# Patient Record
Sex: Female | Born: 1975 | Race: Black or African American | Hispanic: No | Smoking: Never smoker
Health system: Southern US, Community
[De-identification: ages and names within clinical notes are randomized; demographics above are authoritative.]

## PROBLEM LIST (undated history)

## (undated) DIAGNOSIS — R42 Dizziness and giddiness: Secondary | ICD-10-CM

## (undated) DIAGNOSIS — M329 Systemic lupus erythematosus, unspecified: Secondary | ICD-10-CM

## (undated) DIAGNOSIS — E669 Obesity, unspecified: Secondary | ICD-10-CM

## (undated) DIAGNOSIS — Z8673 Personal history of transient ischemic attack (TIA), and cerebral infarction without residual deficits: Secondary | ICD-10-CM

## (undated) DIAGNOSIS — IMO0002 Reserved for concepts with insufficient information to code with codable children: Secondary | ICD-10-CM

## (undated) DIAGNOSIS — Z86718 Personal history of other venous thrombosis and embolism: Secondary | ICD-10-CM

## (undated) DIAGNOSIS — Z8759 Personal history of other complications of pregnancy, childbirth and the puerperium: Secondary | ICD-10-CM

---

## 1996-09-13 DIAGNOSIS — Z8673 Personal history of transient ischemic attack (TIA), and cerebral infarction without residual deficits: Secondary | ICD-10-CM

## 1996-09-13 HISTORY — DX: Personal history of transient ischemic attack (TIA), and cerebral infarction without residual deficits: Z86.73

## 2007-09-14 DIAGNOSIS — Z86718 Personal history of other venous thrombosis and embolism: Secondary | ICD-10-CM

## 2007-09-14 DIAGNOSIS — Z8759 Personal history of other complications of pregnancy, childbirth and the puerperium: Secondary | ICD-10-CM

## 2007-09-14 HISTORY — DX: Personal history of other complications of pregnancy, childbirth and the puerperium: Z87.59

## 2007-09-14 HISTORY — DX: Personal history of other complications of pregnancy, childbirth and the puerperium: Z86.718

## 2017-10-10 ENCOUNTER — Emergency Department (HOSPITAL_COMMUNITY): Payer: Medicare Other

## 2017-10-10 ENCOUNTER — Other Ambulatory Visit: Payer: Self-pay

## 2017-10-10 ENCOUNTER — Observation Stay (HOSPITAL_COMMUNITY): Payer: Medicare Other

## 2017-10-10 ENCOUNTER — Observation Stay (HOSPITAL_COMMUNITY)
Admission: EM | Admit: 2017-10-10 | Discharge: 2017-10-11 | Disposition: A | Discharge status: Home / Self Care | Payer: Medicare Other | Attending: Internal Medicine | Admitting: Internal Medicine

## 2017-10-10 ENCOUNTER — Encounter (HOSPITAL_COMMUNITY): Payer: Self-pay

## 2017-10-10 DIAGNOSIS — Z8673 Personal history of transient ischemic attack (TIA), and cerebral infarction without residual deficits: Secondary | ICD-10-CM

## 2017-10-10 DIAGNOSIS — R4781 Slurred speech: Secondary | ICD-10-CM | POA: Diagnosis not present

## 2017-10-10 DIAGNOSIS — R29810 Facial weakness: Secondary | ICD-10-CM | POA: Diagnosis not present

## 2017-10-10 DIAGNOSIS — H538 Other visual disturbances: Secondary | ICD-10-CM | POA: Insufficient documentation

## 2017-10-10 DIAGNOSIS — Z86718 Personal history of other venous thrombosis and embolism: Secondary | ICD-10-CM | POA: Diagnosis not present

## 2017-10-10 DIAGNOSIS — R05 Cough: Secondary | ICD-10-CM | POA: Insufficient documentation

## 2017-10-10 DIAGNOSIS — R51 Headache: Secondary | ICD-10-CM | POA: Insufficient documentation

## 2017-10-10 DIAGNOSIS — M3210 Systemic lupus erythematosus, organ or system involvement unspecified: Secondary | ICD-10-CM | POA: Diagnosis not present

## 2017-10-10 DIAGNOSIS — R42 Dizziness and giddiness: Secondary | ICD-10-CM | POA: Diagnosis not present

## 2017-10-10 DIAGNOSIS — R8290 Unspecified abnormal findings in urine: Secondary | ICD-10-CM | POA: Diagnosis present

## 2017-10-10 DIAGNOSIS — R509 Fever, unspecified: Secondary | ICD-10-CM | POA: Insufficient documentation

## 2017-10-10 DIAGNOSIS — Z8759 Personal history of other complications of pregnancy, childbirth and the puerperium: Secondary | ICD-10-CM

## 2017-10-10 DIAGNOSIS — M329 Systemic lupus erythematosus, unspecified: Secondary | ICD-10-CM | POA: Diagnosis present

## 2017-10-10 DIAGNOSIS — IMO0002 Reserved for concepts with insufficient information to code with codable children: Secondary | ICD-10-CM | POA: Diagnosis present

## 2017-10-10 HISTORY — DX: Systemic lupus erythematosus, unspecified: M32.9

## 2017-10-10 HISTORY — DX: Obesity, unspecified: E66.9

## 2017-10-10 HISTORY — DX: Dizziness and giddiness: R42

## 2017-10-10 HISTORY — DX: Personal history of other venous thrombosis and embolism: Z86.718

## 2017-10-10 HISTORY — DX: Personal history of transient ischemic attack (TIA), and cerebral infarction without residual deficits: Z86.73

## 2017-10-10 HISTORY — DX: Reserved for concepts with insufficient information to code with codable children: IMO0002

## 2017-10-10 HISTORY — DX: Personal history of other complications of pregnancy, childbirth and the puerperium: Z87.59

## 2017-10-10 LAB — URINALYSIS, ROUTINE W REFLEX MICROSCOPIC
BILIRUBIN URINE: NEGATIVE
Glucose, UA: NEGATIVE mg/dL
Ketones, ur: NEGATIVE mg/dL
Nitrite: NEGATIVE
PROTEIN: NEGATIVE mg/dL
Specific Gravity, Urine: 1.009 (ref 1.005–1.030)
pH: 5 (ref 5.0–8.0)

## 2017-10-10 LAB — COMPREHENSIVE METABOLIC PANEL
ALT: 14 U/L (ref 14–54)
ANION GAP: 9 (ref 5–15)
AST: 14 U/L — ABNORMAL LOW (ref 15–41)
Albumin: 4 g/dL (ref 3.5–5.0)
Alkaline Phosphatase: 92 U/L (ref 38–126)
BILIRUBIN TOTAL: 0.9 mg/dL (ref 0.3–1.2)
BUN: 6 mg/dL (ref 6–20)
CO2: 23 mmol/L (ref 22–32)
Calcium: 9.3 mg/dL (ref 8.9–10.3)
Chloride: 105 mmol/L (ref 101–111)
Creatinine, Ser: 0.78 mg/dL (ref 0.44–1.00)
GFR calc Af Amer: >60 mL/min (ref >60)
Glucose, Bld: 102 mg/dL — ABNORMAL HIGH (ref 65–99)
POTASSIUM: 4.3 mmol/L (ref 3.5–5.1)
Sodium: 137 mmol/L (ref 135–145)
TOTAL PROTEIN: 7.4 g/dL (ref 6.5–8.1)

## 2017-10-10 LAB — CBC
HEMATOCRIT: 37.5 % (ref 36.0–46.0)
HEMOGLOBIN: 12.3 g/dL (ref 12.0–15.0)
MCH: 26.6 pg (ref 26.0–34.0)
MCHC: 32.8 g/dL (ref 30.0–36.0)
MCV: 81.2 fL (ref 78.0–100.0)
Platelets: 255 10*3/uL (ref 150–400)
RBC: 4.62 MIL/uL (ref 3.87–5.11)
RDW: 14.6 % (ref 11.5–15.5)
WBC: 6.7 10*3/uL (ref 4.0–10.5)

## 2017-10-10 LAB — I-STAT CHEM 8, ED
BUN: 6 mg/dL (ref 6–20)
Calcium, Ion: 1.18 mmol/L (ref 1.15–1.40)
Chloride: 104 mmol/L (ref 101–111)
Creatinine, Ser: 0.7 mg/dL (ref 0.44–1.00)
Glucose, Bld: 101 mg/dL — ABNORMAL HIGH (ref 65–99)
HEMATOCRIT: 39 % (ref 36.0–46.0)
HEMOGLOBIN: 13.3 g/dL (ref 12.0–15.0)
Potassium: 4.4 mmol/L (ref 3.5–5.1)
SODIUM: 139 mmol/L (ref 135–145)
TCO2: 26 mmol/L (ref 22–32)

## 2017-10-10 LAB — I-STAT BETA HCG BLOOD, ED (MC, WL, AP ONLY)

## 2017-10-10 LAB — PROTIME-INR
INR: 1.04
Prothrombin Time: 13.5 seconds (ref 11.4–15.2)

## 2017-10-10 LAB — I-STAT TROPONIN, ED: TROPONIN I, POC: 0 ng/mL (ref 0.00–0.08)

## 2017-10-10 LAB — DIFFERENTIAL
Basophils Absolute: 0 10*3/uL (ref 0.0–0.1)
Basophils Relative: 0 %
EOS PCT: 5 %
Eosinophils Absolute: 0.4 10*3/uL (ref 0.0–0.7)
LYMPHS ABS: 2.2 10*3/uL (ref 0.7–4.0)
Lymphocytes Relative: 33 %
MONOS PCT: 6 %
Monocytes Absolute: 0.4 10*3/uL (ref 0.1–1.0)
NEUTROS PCT: 56 %
Neutro Abs: 3.7 10*3/uL (ref 1.7–7.7)

## 2017-10-10 LAB — RAPID URINE DRUG SCREEN, HOSP PERFORMED
Amphetamines: NOT DETECTED
Barbiturates: NOT DETECTED
Benzodiazepines: NOT DETECTED
COCAINE: NOT DETECTED
OPIATES: NOT DETECTED
Tetrahydrocannabinol: NOT DETECTED

## 2017-10-10 LAB — APTT: aPTT: 29 seconds (ref 24–36)

## 2017-10-10 LAB — ETHANOL

## 2017-10-10 MED ORDER — MORPHINE SULFATE (PF) 4 MG/ML IV SOLN
2.0000 mg | INTRAVENOUS | Status: DC | PRN
Start: 2017-10-10 — End: 2017-10-11
  Administered 2017-10-11 (×2): 2 mg via INTRAVENOUS
  Filled 2017-10-10 (×2): qty 1

## 2017-10-10 MED ORDER — ONDANSETRON HCL 4 MG/2ML IJ SOLN
4.0000 mg | Freq: Once | INTRAMUSCULAR | Status: AC
  Administered 2017-10-10: 4 mg via INTRAVENOUS
  Filled 2017-10-10: qty 2

## 2017-10-10 MED ORDER — PROMETHAZINE HCL 25 MG/ML IJ SOLN: 12.5000 mg | Freq: Four times a day (QID) | INTRAMUSCULAR | Status: DC | PRN

## 2017-10-10 MED ORDER — ONDANSETRON HCL 4 MG/2ML IJ SOLN
4.0000 mg | Freq: Four times a day (QID) | INTRAMUSCULAR | Status: DC | PRN
  Administered 2017-10-11: 4 mg via INTRAVENOUS
  Filled 2017-10-10: qty 2

## 2017-10-10 MED ORDER — DIAZEPAM 5 MG PO TABS
5.0000 mg | ORAL_TABLET | Freq: Once | ORAL | Status: AC
  Administered 2017-10-10: 5 mg via ORAL
  Filled 2017-10-10: qty 1

## 2017-10-10 MED ORDER — LORAZEPAM 2 MG/ML IJ SOLN
0.5000 mg | Freq: Once | INTRAMUSCULAR | Status: AC
  Administered 2017-10-10: 0.5 mg via INTRAVENOUS
  Filled 2017-10-10: qty 1

## 2017-10-10 MED ORDER — GADOBENATE DIMEGLUMINE 529 MG/ML IV SOLN
20.0000 mL | Freq: Once | INTRAVENOUS | Status: AC | PRN
  Administered 2017-10-10: 20 mL via INTRAVENOUS

## 2017-10-10 MED ORDER — MECLIZINE HCL 12.5 MG PO TABS
25.0000 mg | ORAL_TABLET | Freq: Three times a day (TID) | ORAL | Status: DC | PRN
  Administered 2017-10-11 (×2): 25 mg via ORAL
  Filled 2017-10-10 (×2): qty 2

## 2017-10-10 MED ORDER — IOPAMIDOL (ISOVUE-370) INJECTION 76%
INTRAVENOUS | Status: AC
  Administered 2017-10-10: 50 mL
  Filled 2017-10-10: qty 50

## 2017-10-10 NOTE — ED Notes (Addendum)
Patient transported to Xray and MRI  

## 2017-10-10 NOTE — ED Notes (Signed)
Patient transported to CT 

## 2017-10-10 NOTE — Code Documentation (Signed)
Code stroke canceled on arrival per Dr. Laurence SlateAroor.  [facility] notified to cancel code stroke.

## 2017-10-10 NOTE — H&P (Signed)
History and Physical    Marissa Seara Maniscalco RUE:[id number]:[id number] DOB: 11/07/1975 DOA: 10/10/2017  **Will will place patient in observation status based on the expectation that the patient will need hospitalization/ hospital care for <24 hours  PCP: Patient, No Pcp Per Gentry Fitz/UNASSIGNED  Attending physician: Ophelia CharterYates  Patient coming from/Resides with: Private residence  Chief Complaint: Right facial droop and dizziness  HPI: Marissa Spencer is a 42 y.o. female with medical history significant for lupus not on medications, obesity, and history of vertigo.  Patient reports for the past 2 weeks she has been coughing especially at night and unable to sleep due to the coughing as well as having episodes of vomiting which sound like they may be posttussive emesis.  She describes them as flulike symptoms but denies fever.  She says she is coughing up thick sputum.  Today when she awakened she felt very dizzy and weak and has been having fatigue.  She took her daughter to this facility for a preoperative visit and felt like when she was standing up that she was "rocking back and forth, I just felt weird" she also complained of bilateral blurry vision.  She states this is not like vertigo she has had in the past.  The symptoms were accompanied by mild nausea.  Upon arrival to the preoperative evaluation unit staff member felt that the patient was demonstrating right facial drooping and given her above reported symptoms she was sent to the ER for further evaluation and treatment.  Triage documents complaints of dizziness, slurred speech, difficulty with word finding.  She reported to triage nurse that she felt as if the room was spinning.  Code stroke was canceled by neurology after patient arrival.  CT of the head without any acute abnormalities.  CTA head/neck without any abnormalities.  Neurology recommended admit to medicine for possible stroke.  Recommended proceeding with MRI of brain to better evaluate.  When I  questioned patient she denied difficulty with speaking and only reports the dizziness and the disequilibrium symptoms.  ED Course:  Vital Signs: See below CT head, CTA head and neck: As above Chest x-ray: Ordered by EDP but not completed at time of my evaluation Lab data: Sodium 137, potassium 4.3, chloride 105, CO2 23, glucose 102, BUN 6, creatinine 0.78, anion gap 9, LFTs not elevated, POC troponin 0.00, white count 6700 with normal differential, hemoglobin 12.3, platelets 255,000, urinalysis abnormal with hazy appearance, small hemoglobin, small leukocytes, nitrite negative, slightly low specific gravity 1.009, rare bacteria, WBC 6-30, UDS negative, alcohol <10, i-STAT hCG <5.0 Medications and treatments: Zofran 4 mg IV x1, Ativan 0.5 mg IV x1  Review of Systems:  In addition to the HPI above,  No Fever-chills, myalgias or other constitutional symptoms No Headache, changes with Vision or hearing, new weakness, tingling, numbness in any extremity, dysarthria or word finding difficulty, gait disturbance or imbalance, tremors or seizure activity No problems swallowing food or Liquids, indigestion/reflux, choking or coughing while eating, abdominal pain with or after eating No Chest pain, Shortness of Breath, palpitations, orthopnea or DOE No Abdominal pain, melena,hematochezia, dark tarry stools, constipation No dysuria, malodorous urine, hematuria or flank pain No new skin rashes, lesions, masses or bruises, No new joint pains, aches, swelling or redness No recent unintentional weight gain or loss No polyuria, polydypsia or polyphagia   Past Medical History:  Diagnosis Date  . History of cardioembolic cerebrovascular accident (CVA) 1998   DURING PREGNANCY  . History of maternal deep vein thrombosis (DVT) 2009  2009 WHEN PREGNANT  . Lupus   . Obesity   . Vertigo     History reviewed. No pertinent surgical history.  Social History   Socioeconomic History  . Marital status:  Legally Separated    Spouse name: Not on file  . Number of children: Not on file  . Years of education: Not on file  . Highest education level: Not on file  Social Needs  . Financial resource strain: Not on file  . Food insecurity - worry: Not on file  . Food insecurity - inability: Not on file  . Transportation needs - medical: Not on file  . Transportation needs - non-medical: Not on file  Occupational History  . Not on file  Tobacco Use  . Smoking status: Never Smoker  . Smokeless tobacco: Never Used  Substance and Sexual Activity  . Alcohol use: No    Frequency: Never  . Drug use: Not on file  . Sexual activity: Not on file  Other Topics Concern  . Not on file  Social History Narrative  . Not on file    Mobility: Independent Work history: Disabled   Allergies  Allergen Reactions  . Sulfa Antibiotics Rash    Family History  Problem Relation Age of Onset  . Hypertension Mother   . Kidney disease Father   . Diabetes Sister   . Bipolar disorder Sister      Prior to Admission medications   Medication Sig Start Date End Date Taking? Authorizing Provider  HYDROcodone-acetaminophen (NORCO/VICODIN) 5-325 MG tablet Take 1 tablet by mouth every 6 (six) hours as needed for pain. 09/28/17  Yes [provider]  ibuprofen (ADVIL,MOTRIN) 200 MG tablet Take 200-400 mg by mouth every 6 (six) hours as needed (for pain or headaches).   Yes [provider]  methocarbamol (ROBAXIN) 500 MG tablet Take 500-1,000 mg by mouth 4 (four) times daily as needed for muscle spasms.  09/28/17  Yes [provider]    Physical Exam: Vitals:   10/10/17 1138 10/10/17 1145  BP: 108/79 131/72  Pulse: 88 85  Resp:  (!) 22  SpO2: 97% 99%      Constitutional: NAD, calm, uncomfortable 2/2 ongoing disequilibrium symptoms Eyes: PERRL, lids and conjunctivae normal ENMT: Mucous membranes are dry. Posterior pharynx clear of any exudate or lesions. Neck: normal, supple,  no masses, no thyromegaly Respiratory: clear to auscultation bilaterally, no wheezing, no crackles. Normal respiratory effort. No accessory muscle use.  Cardiovascular: Regular rate and rhythm, no murmurs / rubs / gallops. No extremity edema. 2+ pedal pulses. No carotid bruits.  Abdomen: no tenderness, no masses palpated. No hepatosplenomegaly. Bowel sounds positive.  Musculoskeletal: no clubbing / cyanosis. No joint deformity upper and lower extremities. Good ROM, no contractures. Normal muscle tone.  Skin: no rashes, lesions, ulcers. No induration-she has slightly reddened area beneath both nares Neurologic: CN 2-12 grossly intact.  Patient having difficulty keeping eyes open after receipt of Ativan therefore EOM exam limited but did not elicit vertigo symptoms/nausea or nystagmus with testing.  Sensation intact, DTR normal. Strength 5/5 x all 4 extremities.  Psychiatric: Normal judgment and insight. Drowsy but awakens easily - oriented x 3. Normal mood.    Labs on Admission: I have personally reviewed following labs and imaging studies  CBC: Recent Labs  Lab 10/10/17 1118 10/10/17 1131  WBC 6.7  --   NEUTROABS 3.7  --   HGB 12.3 13.3  HCT 37.5 39.0  MCV 81.2  --   PLT  255  --    Basic Metabolic Panel: Recent Labs  Lab 10/10/17 1118 10/10/17 1131  NA 137 139  K 4.3 4.4  CL 105 104  CO2 23  --   GLUCOSE 102* 101*  BUN 6 6  CREATININE 0.78 0.70  CALCIUM 9.3  --    GFR: CrCl cannot be calculated (Unknown ideal weight.). Liver Function Tests: Recent Labs  Lab 10/10/17 1118  AST 14*  ALT 14  ALKPHOS 92  BILITOT 0.9  PROT 7.4  ALBUMIN 4.0   No results for input(s): LIPASE, AMYLASE in the last 168 hours. No results for input(s): AMMONIA in the last 168 hours. Coagulation Profile: Recent Labs  Lab 10/10/17 1118  INR 1.04   Cardiac Enzymes: No results for input(s): CKTOTAL, CKMB, CKMBINDEX, TROPONINI in the last 168 hours. BNP (last 3 results) No results for  input(s): PROBNP in the last 8760 hours. HbA1C: No results for input(s): HGBA1C in the last 72 hours. CBG: No results for input(s): GLUCAP in the last 168 hours. Lipid Profile: No results for input(s): CHOL, HDL, LDLCALC, TRIG, CHOLHDL, LDLDIRECT in the last 72 hours. Thyroid Function Tests: No results for input(s): TSH, T4TOTAL, FREET4, T3FREE, THYROIDAB in the last 72 hours. Anemia Panel: No results for input(s): VITAMINB12, FOLATE, FERRITIN, TIBC, IRON, RETICCTPCT in the last 72 hours. Urine analysis:    Component Value Date/Time   COLORURINE YELLOW 10/10/2017 1238   APPEARANCEUR HAZY (A) 10/10/2017 1238   LABSPEC 1.009 10/10/2017 1238   PHURINE 5.0 10/10/2017 1238   GLUCOSEU NEGATIVE 10/10/2017 1238   HGBUR SMALL (A) 10/10/2017 1238   BILIRUBINUR NEGATIVE 10/10/2017 1238   KETONESUR NEGATIVE 10/10/2017 1238   PROTEINUR NEGATIVE 10/10/2017 1238   NITRITE NEGATIVE 10/10/2017 1238   LEUKOCYTESUR SMALL (A) 10/10/2017 1238   Sepsis Labs: @LABRCNTIP (procalcitonin:4,lacticidven:4) )No results found for this or any previous visit (from the past 240 hour(s)).   Radiological Exams on Admission: Ct Angio Head W Or Wo Contrast  Result Date: 10/10/2017 CLINICAL DATA:  42 y/o F; code stroke this morning with dizziness. Right-sided headache. EXAM: CT ANGIOGRAPHY HEAD AND NECK TECHNIQUE: Multidetector CT imaging of the head and neck was performed using the standard protocol during bolus administration of intravenous contrast. Multiplanar CT image reconstructions and MIPs were obtained to evaluate the vascular anatomy. Carotid stenosis measurements (when applicable) are obtained utilizing NASCET criteria, using the distal internal carotid diameter as the denominator. CONTRAST:  50mL ISOVUE-370 IOPAMIDOL (ISOVUE-370) INJECTION 76% COMPARISON:  10/10/2017 CT head FINDINGS: CTA NECK FINDINGS Aortic arch: Standard branching. Imaged portion shows no evidence of aneurysm or dissection. No significant  stenosis of the major arch vessel origins. Right carotid system: No evidence of dissection, stenosis (50% or greater) or occlusion. Left carotid system: No evidence of dissection, stenosis (50% or greater) or occlusion. Vertebral arteries: Left dominant. Accessory right V1. No evidence of dissection, stenosis (50% or greater) or occlusion. Skeleton: Negative. Other neck: Negative. Upper chest: Negative. Review of the MIP images confirms the above findings CTA HEAD FINDINGS Anterior circulation: No significant stenosis, proximal occlusion, aneurysm, or vascular malformation. Posterior circulation: No significant stenosis, proximal occlusion, aneurysm, or vascular malformation. Venous sinuses: As permitted by contrast timing, patent. Anatomic variants: Bilateral posterior communicating arteries. Fenestrated anterior communicating artery. Delayed phase: No abnormal intracranial enhancement. Review of the MIP images confirms the above findings IMPRESSION: 1. Patent carotid and vertebral arteries. No dissection, aneurysm, or hemodynamically significant stenosis utilizing NASCET criteria. 2. Patent anterior and posterior intracranial circulation. No large vessel  occlusion, aneurysm, or significant stenosis. Electronically Signed   By: Mitzi Hansen M.D.   On: 10/10/2017 13:41   Dg Chest 2 View  Result Date: 10/10/2017 CLINICAL DATA:  Cough and slurred speech. EXAM: CHEST  2 VIEW COMPARISON:  None. FINDINGS: The heart size and mediastinal contours are within normal limits. Both lungs are clear. The visualized skeletal structures are unremarkable. IMPRESSION: No active cardiopulmonary disease. Electronically Signed   By: Harmon Pier M.D.   On: 10/10/2017 16:20   Ct Angio Neck W Or Wo Contrast  Result Date: 10/10/2017 CLINICAL DATA:  42 y/o F; code stroke this morning with dizziness. Right-sided headache. EXAM: CT ANGIOGRAPHY HEAD AND NECK TECHNIQUE: Multidetector CT imaging of the head and neck was  performed using the standard protocol during bolus administration of intravenous contrast. Multiplanar CT image reconstructions and MIPs were obtained to evaluate the vascular anatomy. Carotid stenosis measurements (when applicable) are obtained utilizing NASCET criteria, using the distal internal carotid diameter as the denominator. CONTRAST:  50mL ISOVUE-370 IOPAMIDOL (ISOVUE-370) INJECTION 76% COMPARISON:  10/10/2017 CT head FINDINGS: CTA NECK FINDINGS Aortic arch: Standard branching. Imaged portion shows no evidence of aneurysm or dissection. No significant stenosis of the major arch vessel origins. Right carotid system: No evidence of dissection, stenosis (50% or greater) or occlusion. Left carotid system: No evidence of dissection, stenosis (50% or greater) or occlusion. Vertebral arteries: Left dominant. Accessory right V1. No evidence of dissection, stenosis (50% or greater) or occlusion. Skeleton: Negative. Other neck: Negative. Upper chest: Negative. Review of the MIP images confirms the above findings CTA HEAD FINDINGS Anterior circulation: No significant stenosis, proximal occlusion, aneurysm, or vascular malformation. Posterior circulation: No significant stenosis, proximal occlusion, aneurysm, or vascular malformation. Venous sinuses: As permitted by contrast timing, patent. Anatomic variants: Bilateral posterior communicating arteries. Fenestrated anterior communicating artery. Delayed phase: No abnormal intracranial enhancement. Review of the MIP images confirms the above findings IMPRESSION: 1. Patent carotid and vertebral arteries. No dissection, aneurysm, or hemodynamically significant stenosis utilizing NASCET criteria. 2. Patent anterior and posterior intracranial circulation. No large vessel occlusion, aneurysm, or significant stenosis. Electronically Signed   By: Mitzi Hansen M.D.   On: 10/10/2017 13:41   Ct Head Code Stroke Wo Contrast  Result Date: 10/10/2017 CLINICAL DATA:   Code stroke. Acute onset of dizziness. Slurred speech. EXAM: CT HEAD WITHOUT CONTRAST TECHNIQUE: Contiguous axial images were obtained from the base of the skull through the vertex without intravenous contrast. COMPARISON:  None. FINDINGS: Brain: No acute infarct, hemorrhage, or mass lesion is present. The ventricles are of normal size. No significant white matter disease is present. The brainstem and cerebellum are normal. Vascular: No hyperdense vessel or unexpected calcification. Skull: Calvarium is intact. No focal lytic or blastic lesions are present. Sinuses/Orbits: Mild mucosal thickening is present throughout the ethmoid air cells and bilateral maxillary sinuses. No fluid levels are present. There is some fluid in the inferior right mastoid air cells. Left mastoid air cells and bilateral middle ear cavities are clear. ASPECTS [facility] Stroke Program Early CT Score) - Ganglionic level infarction (caudate, lentiform nuclei, internal capsule, insula, M1-M3 cortex): 7/7 - Supraganglionic infarction (M4-M6 cortex): 3/3 Total score (0-10 with 10 being normal): 10/10 IMPRESSION: 1. Normal CT appearance of the brain 2. Mild sinus disease. 3. ASPECTS is 10/10 The above was relayed via text pager to Dr. Laurence Slate on 10/10/2017 at 11:34 . Electronically Signed   By: Marin Roberts M.D.   On: 10/10/2017 11:35    EKG: (Independently reviewed)  sinus rhythm with ventricular rate 98 bpm, QTC 475 ms, normal R wave rotation, no acute ischemic changes  Assessment/Plan Principal Problem:   Facial droop/ History of CVA (cerebrovascular accident) -Patient presents with dizziness and disequilibrium symptoms with reported right facial droop-I did not appreciate right facial droop on exam -Neurology has evaluated and code stroke canceled but recommends obtain MRI to rule out stroke -In review of CareEverywhere/rheumatology notes from [facility] patient reportedly had a stroke while pregnant in 1998 and based on rheumatology note  from September 2017 she was supposed to be taking low-dose Lipitor and aspirin 81 mg daily -Stat MRI brain -Frequent neuro checks -CT/CT angiogram imaging without any concerning findings -We will defer on ordering echocardiogram, carotid duplex, PT/OT/SLP evaluation, HgbA1c/FLP unless MR positive for stroke -Patient has been sick with possible viral illness versus lupus flare for 2 weeks and therefore right facial drooping could be more reflective of Bell's palsy -EDP has previously ordered RN stroke swallow screen so will remain NPO until completed -Has prior history of vertigo and reports dizziness with standing and recent recurrent nausea and vomiting so we will check orthostatic vital signs-patient may need IV fluids/IV bolus -IV Zofran/Phenergan PRN for nausea and vomiting -If evaluation more consistent with vertigo then will need to begin Antivert  Active Problems:   Abnormal urinalysis -Urinalysis concerning for either dehydration or UTI although patient asymptomatic regarding UTI symptoms -Urine culture -No proteinuria so doubt lupus related nephritis    Lupus -In review of [facility] rheumatology notes patient apparently diagnosed with lupus at age 38 and re-diagnosed at age 89 after a stroke through "blood work".  Her other associated symptoms included hair loss, nasal and scalp sores and frequent illnesses -She established care with rheumatologist at [facility] in September 2017 and was resumed on Plaquenil-has never taken medications such as CellCept -[facility] documents the positive ANA -Patient reporting excessive fatigue as well as a reddened area underneath the nares that she attributes to recent congestion although this could also be related to lupus flare -Patient states has not established with either PCP or rheumatologist in [place] consulted this admission to help patient with establishing with local PCP who then can reinitiate patient's Plaquenil-she apparently has established  residency here in [place] and now has Medicare. -I suspect her fatigue sx's are reflective of untreated lupus and she needs to resume her Plaquenil -Of note, telephone documentation from the rheumatology clinic at [facility] both in March and April 2018 demonstrate attempts to notify patient for an appointment.     Cough -She reports 2 weeks of cough with what sounds like post-tussive emesis; states thick productive cough but unable to say if clear or color in sputum -Chest x-ray ordered by EDP and is pending relation    History of maternal deep vein thrombosis (DVT) -History obtained from documentation from [facility] -Without any lower extremity swelling      DVT prophylaxis: Lovenox Code Status: Full Family Communication: Daughter at bedside Disposition Plan: Home Consults called: Neurology/Aroor    Russella Dar ANP-BC [facility] Pager [phone]   If 7PM-7AM, please contact night-coverage [email or web address] Password [facility]  10/10/2017, 4:31 PM

## 2017-10-10 NOTE — ED Triage Notes (Signed)
[facility]- pt arrives from doctor office with complaint of dizziness, slurred speech, difficulty word finding. Pt alert and oriented. States she feels as though the room is spinning. Code stroke cancelled by neurology on arrival.

## 2017-10-10 NOTE — ED Provider Notes (Signed)
[facility] EMERGENCY DEPARTMENT Provider Note   CSN: [id number] Arrival date & time: 10/10/17  1114     History   Chief Complaint Chief Complaint  Patient presents with  . Dizziness    HPI Marissa Spencer is a 42 y.o. female.  The history is provided by the patient and the EMS personnel. No language interpreter was used.  Dizziness    Marissa Spencer is a 42 y.o. female who presents to the Emergency Department complaining of dizziness.  Has a history of lupus and prior CVA and reports dizziness that started this morning.last known normal last night.  She reports several days of temperature to 101 at home with mild cough and feeling poorly.  This morning when she awoke she felt very dizzy with blurred vision.  Prior to ED arrival she had speech difficulties per EMS with slurred and altered speech as well as right facial droop and she presented as a code stroke.  Patient endorses nausea as well as dizziness.  Past Medical History:  Diagnosis Date  . History of cardioembolic cerebrovascular accident (CVA) 1998   DURING PREGNANCY  . History of maternal deep vein thrombosis (DVT) 2009   2009 WHEN PREGNANT  . Lupus   . Obesity   . Vertigo     Patient Active Problem List   Diagnosis Date Noted  . Facial droop 10/10/2017  . Abnormal urinalysis 10/10/2017  . Lupus 10/10/2017  . History of maternal deep vein thrombosis (DVT) 10/10/2017  . History of CVA (cerebrovascular accident) 10/10/2017    History reviewed. No pertinent surgical history.  OB History    No data available       Home Medications    Prior to Admission medications   Medication Sig Start Date End Date Taking? Authorizing Provider  HYDROcodone-acetaminophen (NORCO/VICODIN) 5-325 MG tablet Take 1 tablet by mouth every 6 (six) hours as needed for pain. 09/28/17  Yes [provider]  ibuprofen (ADVIL,MOTRIN) 200 MG tablet Take 200-400 mg by mouth every 6 (six) hours as needed (for  pain or headaches).   Yes [provider]  methocarbamol (ROBAXIN) 500 MG tablet Take 500-1,000 mg by mouth 4 (four) times daily as needed for muscle spasms.  09/28/17  Yes [provider]    Family History Family History  Problem Relation Age of Onset  . Hypertension Mother   . Kidney disease Father   . Diabetes Sister   . Bipolar disorder Sister     Social History Social History   Tobacco Use  . Smoking status: Never Smoker  . Smokeless tobacco: Never Used  Substance Use Topics  . Alcohol use: No    Frequency: Never  . Drug use: Not on file     Allergies   Sulfa antibiotics   Review of Systems Review of Systems  Neurological: Positive for dizziness.  All other systems reviewed and are negative.    Physical Exam Updated Vital Signs BP 131/72   Pulse 85   Resp (!) 22   LMP 09/16/2017   SpO2 99%   Physical Exam  Constitutional: She appears well-developed and well-nourished.  Uncomfortable appearing  HENT:  Head: Normocephalic and atraumatic.  Cardiovascular: Normal rate and regular rhythm.  No murmur heard. Pulmonary/Chest: Effort normal and breath sounds normal. No respiratory distress.  Abdominal: Soft. There is no tenderness. There is no rebound and no guarding.  Musculoskeletal: She exhibits no edema or tenderness.  Neurological: She is alert.  Right facial droop.  Pupils equal  round and reactive, EOMI.  5 out of 5 strength in all 4 extremities.  Skin: Skin is warm and dry. Capillary refill takes less than 2 seconds.  Psychiatric: She has a normal mood and affect. Her behavior is normal.  Nursing note and vitals reviewed.    ED Treatments / Results  Labs (all labs ordered are listed, but only abnormal results are displayed) Labs Reviewed  COMPREHENSIVE METABOLIC PANEL - Abnormal; Notable for the following components:      Result Value   Glucose, Bld 102 (*)    AST 14 (*)    All other components within normal limits    URINALYSIS, ROUTINE W REFLEX MICROSCOPIC - Abnormal; Notable for the following components:   APPearance HAZY (*)    Hgb urine dipstick SMALL (*)    Leukocytes, UA SMALL (*)    Bacteria, UA RARE (*)    Squamous Epithelial / LPF 0-5 (*)    All other components within normal limits  I-STAT CHEM 8, ED - Abnormal; Notable for the following components:   Glucose, Bld 101 (*)    All other components within normal limits  URINE CULTURE  ETHANOL  PROTIME-INR  APTT  CBC  DIFFERENTIAL  RAPID URINE DRUG SCREEN, HOSP PERFORMED  I-STAT TROPONIN, ED  I-STAT BETA HCG BLOOD, ED ([facility], [facility], [facility] ONLY)    EKG  EKG Interpretation  Date/Time:  Monday October 10 2017 11:39:32 EST Ventricular Rate:  98 PR Interval:    QRS Duration: 83 QT Interval:  372 QTC Calculation: 475 R Axis:   60 Text Interpretation:  Sinus rhythm Low voltage, precordial leads Confirmed by Tilden Fossa [phone]) on 10/10/2017 11:46:03 AM       Radiology Ct Angio Head W Or Wo Contrast  Result Date: 10/10/2017 CLINICAL DATA:  42 y/o F; code stroke this morning with dizziness. Right-sided headache. EXAM: CT ANGIOGRAPHY HEAD AND NECK TECHNIQUE: Multidetector CT imaging of the head and neck was performed using the standard protocol during bolus administration of intravenous contrast. Multiplanar CT image reconstructions and MIPs were obtained to evaluate the vascular anatomy. Carotid stenosis measurements (when applicable) are obtained utilizing NASCET criteria, using the distal internal carotid diameter as the denominator. CONTRAST:  50mL ISOVUE-370 IOPAMIDOL (ISOVUE-370) INJECTION 76% COMPARISON:  10/10/2017 CT head FINDINGS: CTA NECK FINDINGS Aortic arch: Standard branching. Imaged portion shows no evidence of aneurysm or dissection. No significant stenosis of the major arch vessel origins. Right carotid system: No evidence of dissection, stenosis (50% or greater) or occlusion. Left carotid system: No evidence of dissection,  stenosis (50% or greater) or occlusion. Vertebral arteries: Left dominant. Accessory right V1. No evidence of dissection, stenosis (50% or greater) or occlusion. Skeleton: Negative. Other neck: Negative. Upper chest: Negative. Review of the MIP images confirms the above findings CTA HEAD FINDINGS Anterior circulation: No significant stenosis, proximal occlusion, aneurysm, or vascular malformation. Posterior circulation: No significant stenosis, proximal occlusion, aneurysm, or vascular malformation. Venous sinuses: As permitted by contrast timing, patent. Anatomic variants: Bilateral posterior communicating arteries. Fenestrated anterior communicating artery. Delayed phase: No abnormal intracranial enhancement. Review of the MIP images confirms the above findings IMPRESSION: 1. Patent carotid and vertebral arteries. No dissection, aneurysm, or hemodynamically significant stenosis utilizing NASCET criteria. 2. Patent anterior and posterior intracranial circulation. No large vessel occlusion, aneurysm, or significant stenosis. Electronically Signed   By: Mitzi Hansen M.D.   On: 10/10/2017 13:41   Dg Chest 2 View  Result Date: 10/10/2017 CLINICAL DATA:  Cough and slurred speech. EXAM:  CHEST  2 VIEW COMPARISON:  None. FINDINGS: The heart size and mediastinal contours are within normal limits. Both lungs are clear. The visualized skeletal structures are unremarkable. IMPRESSION: No active cardiopulmonary disease. Electronically Signed   By: Harmon PierJeffrey  Hu M.D.   On: 10/10/2017 16:20   Ct Angio Neck W Or Wo Contrast  Result Date: 10/10/2017 CLINICAL DATA:  42 y/o F; code stroke this morning with dizziness. Right-sided headache. EXAM: CT ANGIOGRAPHY HEAD AND NECK TECHNIQUE: Multidetector CT imaging of the head and neck was performed using the standard protocol during bolus administration of intravenous contrast. Multiplanar CT image reconstructions and MIPs were obtained to evaluate the vascular anatomy.  Carotid stenosis measurements (when applicable) are obtained utilizing NASCET criteria, using the distal internal carotid diameter as the denominator. CONTRAST:  50mL ISOVUE-370 IOPAMIDOL (ISOVUE-370) INJECTION 76% COMPARISON:  10/10/2017 CT head FINDINGS: CTA NECK FINDINGS Aortic arch: Standard branching. Imaged portion shows no evidence of aneurysm or dissection. No significant stenosis of the major arch vessel origins. Right carotid system: No evidence of dissection, stenosis (50% or greater) or occlusion. Left carotid system: No evidence of dissection, stenosis (50% or greater) or occlusion. Vertebral arteries: Left dominant. Accessory right V1. No evidence of dissection, stenosis (50% or greater) or occlusion. Skeleton: Negative. Other neck: Negative. Upper chest: Negative. Review of the MIP images confirms the above findings CTA HEAD FINDINGS Anterior circulation: No significant stenosis, proximal occlusion, aneurysm, or vascular malformation. Posterior circulation: No significant stenosis, proximal occlusion, aneurysm, or vascular malformation. Venous sinuses: As permitted by contrast timing, patent. Anatomic variants: Bilateral posterior communicating arteries. Fenestrated anterior communicating artery. Delayed phase: No abnormal intracranial enhancement. Review of the MIP images confirms the above findings IMPRESSION: 1. Patent carotid and vertebral arteries. No dissection, aneurysm, or hemodynamically significant stenosis utilizing NASCET criteria. 2. Patent anterior and posterior intracranial circulation. No large vessel occlusion, aneurysm, or significant stenosis. Electronically Signed   By: Mitzi HansenLance  Furusawa-Stratton M.D.   On: 10/10/2017 13:41   Ct Head Code Stroke Wo Contrast  Result Date: 10/10/2017 CLINICAL DATA:  Code stroke. Acute onset of dizziness. Slurred speech. EXAM: CT HEAD WITHOUT CONTRAST TECHNIQUE: Contiguous axial images were obtained from the base of the skull through the vertex  without intravenous contrast. COMPARISON:  None. FINDINGS: Brain: No acute infarct, hemorrhage, or mass lesion is present. The ventricles are of normal size. No significant white matter disease is present. The brainstem and cerebellum are normal. Vascular: No hyperdense vessel or unexpected calcification. Skull: Calvarium is intact. No focal lytic or blastic lesions are present. Sinuses/Orbits: Mild mucosal thickening is present throughout the ethmoid air cells and bilateral maxillary sinuses. No fluid levels are present. There is some fluid in the inferior right mastoid air cells. Left mastoid air cells and bilateral middle ear cavities are clear. ASPECTS [facility]([place] Stroke Program Early CT Score) - Ganglionic level infarction (caudate, lentiform nuclei, internal capsule, insula, M1-M3 cortex): 7/7 - Supraganglionic infarction (M4-M6 cortex): 3/3 Total score (0-10 with 10 being normal): 10/10 IMPRESSION: 1. Normal CT appearance of the brain 2. Mild sinus disease. 3. ASPECTS is 10/10 The above was relayed via text pager to Dr. Laurence SlateAroor on 10/10/2017 at 11:34 . Electronically Signed   By: Marin Robertshristopher  Mattern M.D.   On: 10/10/2017 11:35    Procedures Procedures (including critical care time)  Medications Ordered in ED Medications  ondansetron (ZOFRAN) injection 4 mg (not administered)    Or  promethazine (PHENERGAN) injection 12.5 mg (not administered)  ondansetron (ZOFRAN) injection 4 mg (4 mg  Intravenous Given 10/10/17 1156)  iopamidol (ISOVUE-370) 76 % injection (50 mLs  Contrast Given 10/10/17 1253)  LORazepam (ATIVAN) injection 0.5 mg (0.5 mg Intravenous Given 10/10/17 1235)     Initial Impression / Assessment and Plan / ED Course  I have reviewed the triage vital signs and the nursing notes.  Pertinent labs & imaging results that were available during my care of the patient were reviewed by me and considered in my medical decision making (see chart for details).     Patient here for evaluation of  dizziness, facial droop, speech difficulties prior to ED arrival.  She is uncomfortable appearing on examination and appears very symptomatic.  She has facial droop but no other focal neurologic findings.  She presented as a code stroke and this was canceled after evaluation by the neurologist in the emergency department.  She was treated with Ativan for dizziness with no significant improvement in her symptoms.  Plan to admit to medicine service for further evaluation and treatment of her symptoms per neurology recommendations.  Final Clinical Impressions(s) / ED Diagnoses   Final diagnoses:  None    ED Discharge Orders    None       Tilden Fossa, MD 10/10/17 [phone]

## 2017-10-10 NOTE — Consult Note (Addendum)
Requesting Physician: Dr. Madilyn Hook    Chief Complaint: Dizziness  History obtained from: [name]  HPI:                                                                                                                                       Marissa Spencer is an 42 y.o. 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 LKN was stated by EMS to be 10.40, however patient states her symptoms started on waking up in the morning.  She states she felt dizzy and felt her vision was off this morning. Her high school daughter arrived home this morning and she drove her duaghter to orthopedic clinic despite not feeling well. At the clinic, staff noted patient had slurred speech and her dizziness got worse and EMS was alerted.   On arrival patient noted to have right LMN facial palsy, right eye ptosis. Patient also states her entire right eye vision is blurry and slightly weaker right hand grip strength. CT head was negative for hemorrhage and stroke alert cancelled as pt not an LVO and outside tPA window.  She states she has been having cough and low grade fever x 1 week.     Date last known well: 1.27.19 Time last known well: ? Sometime last night before going to bed tPA Given: no, outside window Baseline MRS 1     History reviewed. No pertinent past medical history.  History reviewed. No pertinent surgical history.  History reviewed. No pertinent family history. Social History:  reports that  has never smoked. she has never used smokeless tobacco. She reports that she does not drink alcohol. Her drug history is not on file.  Allergies:  Allergies  Allergen Reactions  . Sulfa Antibiotics     Medications:                                                                                                                        I reviewed home medications. None    ROS:  14 systems reviewed and negative except above    Examination:                                                                                                      General: Appears well-developed and well-nourished.  Psych: Affect appropriate to situation Eyes: No scleral injection HENT: No OP obstrucion Head: Normocephalic.  Cardiovascular: Normal rate and regular rhythm.  Respiratory: Effort normal and breath sounds normal to anterior ascultation GI: Soft.  No distension. There is no tenderness.  Skin: WDI   Neurological Examination Mental Status: Alert, oriented, thought content appropriate.  Speech fluent without evidence of aphasia. Able to follow 3 step commands without difficulty. Cranial Nerves: II: Visual fields grossly : difficulty to assess due to non cooperation, III,IV, VI: R eye ptosis, extra-ocular motions intact bilaterally, pupils equal, round, reactive to light and accommodation V,VII: reduced forehead wrinkling,mild NFF on R side of face  VIII: hearing normal bilaterally IX,X: uvula rises symmetrically XI: bilateral shoulder shrug XII: midline tongue extension Motor: Right : Upper extremity   4/5    Left:     Upper extremity   5/5  Lower extremity   5/5     Lower extremity   5/5 Tone and bulk:normal tone throughout; no atrophy noted Sensory: Pinprick and light touch intact throughout, bilaterally Deep Tendon Reflexes: 2+ and symmetric throughout Plantars: Right: downgoing   Left: downgoing Cerebellar: normal finger-to-nose, normal rapid alternating movements and normal heel-to-shin test Gait: normal gait and station     Lab Results: Basic Metabolic Panel: Recent Labs  Lab 10/10/17 1131  NA 139  K 4.4  CL 104  GLUCOSE 101*  BUN 6  CREATININE 0.70    CBC: Recent Labs  Lab 10/10/17 1118 10/10/17 1131  WBC 6.7  --   NEUTROABS 3.7  --   HGB 12.3 13.3  HCT 37.5 39.0  MCV 81.2  --    PLT 255  --     Coagulation Studies: Recent Labs    10/10/17 1118  LABPROT 13.5  INR 1.04    Imaging: Ct Head Code Stroke Wo Contrast  Result Date: 10/10/2017 CLINICAL DATA:  Code stroke. Acute onset of dizziness. Slurred speech. EXAM: CT HEAD WITHOUT CONTRAST TECHNIQUE: Contiguous axial images were obtained from the base of the skull through the vertex without intravenous contrast. COMPARISON:  None. FINDINGS: Brain: No acute infarct, hemorrhage, or mass lesion is present. The ventricles are of normal size. No significant white matter disease is present. The brainstem and cerebellum are normal. Vascular: No hyperdense vessel or unexpected calcification. Skull: Calvarium is intact. No focal lytic or blastic lesions are present. Sinuses/Orbits: Mild mucosal thickening is present throughout the ethmoid air cells and bilateral maxillary sinuses. No fluid levels are present. There is some fluid in the inferior right mastoid air cells. Left mastoid air cells and bilateral middle ear cavities are clear. ASPECTS [facility] Stroke Program Early CT Score) - Ganglionic level infarction (caudate, lentiform nuclei, internal capsule, insula, M1-M3 cortex): 7/7 - Supraganglionic infarction (M4-M6 cortex): 3/3 Total score (0-10 with 10 being normal):  10/10 IMPRESSION: 1. Normal CT appearance of the brain 2. Mild sinus disease. 3. ASPECTS is 10/10 The above was relayed via text pager to Dr. Laurence SlateAroor on 10/10/2017 at 11:34 . Electronically Signed   By: Marin Robertshristopher  Mattern M.D.   On: 10/10/2017 11:35     ASSESSMENT AND PLAN  42 y.o. 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   Possible Stroke  Admit to medicine  CTA head negative for LVO, signficant stenosis, dissection  Mri brain wo contrast showed no acute stroke. MRI Brain w/contrast and MR venogram was ordered to r/o CVT given history of lupus and stroke.    Neurology will follow.    Tosh Glaze [facility] Pager Number [phone]

## 2017-10-10 NOTE — ED Notes (Signed)
During orthostatic vitals, while standing (with assistance), pt stated that her "feet were tingling". Pt also stated that she felt nauseous. Informed Anna - RN.

## 2017-10-11 DIAGNOSIS — R42 Dizziness and giddiness: Secondary | ICD-10-CM | POA: Diagnosis not present

## 2017-10-11 DIAGNOSIS — R29810 Facial weakness: Secondary | ICD-10-CM | POA: Diagnosis not present

## 2017-10-11 MED ORDER — MECLIZINE HCL 25 MG PO TABS: 25.0000 mg | ORAL_TABLET | Freq: Three times a day (TID) | ORAL | 0 refills | Status: AC | PRN

## 2017-10-11 NOTE — Progress Notes (Signed)
Pt reports new onset pain/tenderness to left side of face and left jaw and neck at time of IV Morphine administration. Also at this time, right arm itched all around IV site. Right arm felt "heavy". Itching and heaviness have now subsided, but left face/neck still tender. VS 151/91, 98.0 t, 18r, 56p, O2 sat 100% RA.

## 2017-10-11 NOTE — Discharge Instructions (Signed)

## 2017-10-11 NOTE — Evaluation (Signed)
Physical Therapy Evaluation Patient Details Name: Marissa Spencer MRN: [id number] DOB: 1975/11/28 Today's Date: 10/11/2017   History of Present Illness  Marissa Spencer is a 42 y.o. female with a Past Medical History of vertigo; obesity; and lupus not actively being treated due to lapse in insurance/PCP who presents with severe dizziness and difficulty seeing due to blurred vision.   Clinical Impression  Pt admitted with above diagnosis. Pt currently with functional limitations due to the deficits listed below (see PT Problem List). In bed, pt felt that symptoms had resolved somewhat but when she got up, was still dizzy, experienced blurry vision, required min A and RW to ambulate and even with thee, could not go further than 40'. Do not feel she is safe to return home at this time. Is not able to fully open R eye and keeps downward gaze (she reports because of brightness of lights) so difficult to visualize if she has any nystagmus.  Pt will benefit from skilled PT to increase their independence and safety with mobility to allow discharge to the venue listed below.       Follow Up Recommendations Home health PT    Equipment Recommendations  None recommended by PT    Recommendations for Other Services OT consult     Precautions / Restrictions Precautions Precautions: Fall Restrictions Weight Bearing Restrictions: No      Mobility  Bed Mobility Overal bed mobility: Modified Independent             General bed mobility comments: pt able to get to EOB but with increased difficulty and time needed. Pt with posterior bias and almost fell bkwds but caught self  Transfers Overall transfer level: Needs assistance Equipment used: None Transfers: Sit to/from Stand Sit to Stand: Min assist         General transfer comment: pt needing min A to maintain balance. Unable to stand with erect posture, head down, trembling all over and pt reports dizziness.    Ambulation/Gait Ambulation/Gait assistance: Min assist Ambulation Distance (Feet): 40 Feet Assistive device: Rolling walker (2 wheeled) Gait Pattern/deviations: Step-through pattern;Ataxic Gait velocity: decreased Gait velocity interpretation: <1.8 ft/sec, indicative of risk for recurrent falls General Gait Details: pt not safe in standing without UE support. Was able to ambulate with RW and min A but needed standing rest break after 20'. Had difficulty turning and had tremors BUE and BLE. Staggering gait with increasing dizziness the longer she was up. Also reported feeling very hot.   Stairs            Wheelchair Mobility    Modified Rankin (Stroke Patients Only)       Balance Overall balance assessment: Needs assistance Sitting-balance support: No upper extremity supported Sitting balance-Leahy Scale: Poor Sitting balance - Comments: perios of instability with posterior lean Postural control: Posterior lean Standing balance support: Bilateral upper extremity supported Standing balance-Leahy Scale: Poor Standing balance comment: unable to stand without support                             Pertinent Vitals/Pain Pain Assessment: No/denies pain    Home Living Family/patient expects to be discharged to:: Private residence Living Arrangements: Spouse/significant other;Children Available Help at Discharge: Family;Available PRN/intermittently Type of Home: House Home Access: Stairs to enter   Entrance Stairs-Number of Steps: 3 Home Layout: Two level;Able to live on main level with bedroom/bathroom Home Equipment: Dan Humphreys - 2 wheels;Cane - single point Additional Comments: pt  has 4 children youngest is 9yo. Pt works part time. Husband works, has own business, flexible schedule. Mother and grandmother nearby as well    Prior Function Level of Independence: Independent         Comments: pt is independent but reports that she has been sick for a few weeks  as well as had some life stressors and could tell her SLE was flaring. She has had episodes like this in the past per her report.      Hand Dominance        Extremity/Trunk Assessment   Upper Extremity Assessment Upper Extremity Assessment: Generalized weakness;RUE deficits/detail;LUE deficits/detail RUE Deficits / Details: difficulty lifting arms above head. Pt reports swelling in LUE today that has improved slightly since this morning. Decreaed grip strength bilaterally RUE Coordination: decreased fine motor;decreased gross motor LUE Deficits / Details: same as RUE LUE Coordination: decreased fine motor;decreased gross motor    Lower Extremity Assessment Lower Extremity Assessment: RLE deficits/detail;LLE deficits/detail;Generalized weakness RLE Deficits / Details: knee ext 3/5, hip flex 3/5, unable to accept resistance. LE's trembling with ambulation and knees partially buckling in standing  RLE Coordination: decreased fine motor;decreased gross motor LLE Deficits / Details: same as RLE LLE Coordination: decreased fine motor;decreased gross motor    Cervical / Trunk Assessment Cervical / Trunk Assessment: Kyphotic  Communication   Communication: No difficulties  Cognition Arousal/Alertness: Awake/alert Behavior During Therapy: Flat affect Overall Cognitive Status: Within Functional Limits for tasks assessed                                        General Comments General comments (skin integrity, edema, etc.): pt cannot fully open R eye, she reports this began yesterday. She continues to report blurry vision when mobilizing. Did not visualize any nystagmus but difficult to assess with R eye closed and pt's downward gaze.     Exercises     Assessment/Plan    PT Assessment Patient needs continued PT services  PT Problem List Decreased strength;Decreased activity tolerance;Decreased balance;Decreased mobility;Decreased coordination;Decreased knowledge of use  of DME       PT Treatment Interventions DME instruction;Gait training;Stair training;Functional mobility training;Therapeutic activities;Therapeutic exercise;Balance training;Patient/family education;Neuromuscular re-education    PT Goals (Current goals can be found in the Care Plan section)  Acute Rehab PT Goals Patient Stated Goal: return home PT Goal Formulation: With patient Time For Goal Achievement: 10/25/17 Potential to Achieve Goals: Fair    Frequency Min 3X/week   Barriers to discharge        Co-evaluation               AM-PAC PT "6 Clicks" Daily Activity  Outcome Measure Difficulty turning over in bed (including adjusting bedclothes, sheets and blankets)?: A Little Difficulty moving from lying on back to sitting on the side of the bed? : A Little Difficulty sitting down on and standing up from a chair with arms (e.g., wheelchair, bedside commode, etc,.)?: Unable Help needed moving to and from a bed to chair (including a wheelchair)?: A Little Help needed walking in hospital room?: A Lot Help needed climbing 3-5 steps with a railing? : Total 6 Click Score: 13    End of Session Equipment Utilized During Treatment: Gait belt Activity Tolerance: Other (comment)(limited by dizziness) Patient left: in bed;with bed alarm set;with family/visitor present Nurse Communication: Mobility status PT Visit Diagnosis: Unsteadiness on feet (R26.81);Dizziness and giddiness (  R42);Muscle weakness (generalized) (M62.81)    Time: [id number] PT Time Calculation (min) (ACUTE ONLY): 29 min   Charges:   PT Evaluation $PT Eval Moderate Complexity: 1 Mod PT Treatments $Gait Training: 8-22 mins   PT G Codes:        Lyanne CoVictoria Roslyn Else, PT  Acute Rehab Services  [phone]   [facility] Vedika Dumlao 10/11/2017, 11:35 AM

## 2017-10-11 NOTE — Care Management Note (Addendum)
Case Management Note  Patient Details  Name: Concha Seara Plath MRN: [id number] Date of Birth: 05/02/1976  Subjective/Objective:                CMC to find local PCP.  Attempted to make follow up with 3 different clinics, none had availability to schedule at this time. Placed instructions on AVS to call clinic Monday when appointments open up. Addendum: Spoke w patient, she states she has Medicaid in [place] and at this time wants to go to [place] in [place]. She states she does not have plans on moving. Patient and family at bedside instructed to contact medicaid SW to get name of MD they are assigned to/ assist w Medicaid PCP assignment. Patient declined HH services. States she has a RW and a cane.    Action/Plan:   Expected Discharge Date:                  Expected Discharge Plan:     In-House Referral:     Discharge planning Services  CM Consult  Post Acute Care Choice:    Choice offered to:     DME Arranged:    DME Agency:     HH Arranged:    HH Agency:     Status of Service:  In process, will continue to follow  If discussed at Long Length of Stay Meetings, dates discussed:    Additional Comments:  Lawerance SabalDebbie Deshauna Cayson, RN 10/11/2017, 10:50 AM

## 2017-10-11 NOTE — Progress Notes (Signed)
Discharge instruction reviewed with patient/family. All questions answered at this time. Patient waiting for transport from family.   Sim BoastHavy, RN

## 2017-10-11 NOTE — Discharge Summary (Signed)
[facility]  Physician Discharge Summary   Patient ID: Marissa Spencer MRN: [id number] DOB/AGE: 41/11/1975 41 y.o.  Admit date: 10/10/2017 Discharge date: 10/11/2017  PCP: Patient, No Pcp Per  DISCHARGE DIAGNOSES:  Principal Problem:   Facial droop Active Problems:   Abnormal urinalysis   Lupus   History of maternal deep vein thrombosis (DVT)   History of CVA (cerebrovascular accident)   RECOMMENDATIONS FOR OUTPATIENT FOLLOW UP: 1. Patient plans to return to [place] to see her providers there  DISCHARGE CONDITION: fair  Diet recommendation: As before  INITIAL HISTORY: 42 year old with a past medical history of vertigo, obesity, questionable diagnosis of lupus who [facility] [place].  Presented with severe dizziness blurred vision, concern for facial droop.  Patient was hospitalized for further management.  Consultations:  Neurology   HOSPITAL COURSE:   Patient was hospitalized due to concern for acute stroke.  Patient was seen by neurology.  Patient underwent MRI brain which was unremarkable.  She subsequently underwent MRI brain with contrast and MR venogram.  She also underwent CT angiogram head and neck.  All of the studies have been unremarkable.  It is quite possible patient's symptoms was due to vertigo.  There is also possibility of somatoform disorder/migraine.  She was given meclizine.  Her symptoms have improved.  She wants to go home today.  Seen by physical therapy who recommends home health.  Patient declines home health.  No further neurological workup needed per neurology.  She may need to see neurology as an outpatient.  She will be discharged with prescription for meclizine.  Other issues are all stable.  Okay for discharge home today.    PERTINENT LABS:  The results of significant diagnostics from this hospitalization (including imaging, microbiology, ancillary and laboratory) are listed below for reference.      Labs: Basic Metabolic Panel: Recent Labs  Lab 10/10/17 1118 10/10/17 1131  NA 137 139  K 4.3 4.4  CL 105 104  CO2 23  --   GLUCOSE 102* 101*  BUN 6 6  CREATININE 0.78 0.70  CALCIUM 9.3  --    Liver Function Tests: Recent Labs  Lab 10/10/17 1118  AST 14*  ALT 14  ALKPHOS 92  BILITOT 0.9  PROT 7.4  ALBUMIN 4.0   CBC: Recent Labs  Lab 10/10/17 1118 10/10/17 1131  WBC 6.7  --   NEUTROABS 3.7  --   HGB 12.3 13.3  HCT 37.5 39.0  MCV 81.2  --   PLT 255  --     IMAGING STUDIES Ct Angio Head W Or Wo Contrast  Result Date: 10/10/2017 CLINICAL DATA:  42 y/o F; code stroke this morning with dizziness. Right-sided headache. EXAM: CT ANGIOGRAPHY HEAD AND NECK TECHNIQUE: Multidetector CT imaging of the head and neck was performed using the standard protocol during bolus administration of intravenous contrast. Multiplanar CT image reconstructions and MIPs were obtained to evaluate the vascular anatomy. Carotid stenosis measurements (when applicable) are obtained utilizing NASCET criteria, using the distal internal carotid diameter as the denominator. CONTRAST:  50mL ISOVUE-370 IOPAMIDOL (ISOVUE-370) INJECTION 76% COMPARISON:  10/10/2017 CT head FINDINGS: CTA NECK FINDINGS Aortic arch: Standard branching. Imaged portion shows no evidence of aneurysm or dissection. No significant stenosis of the major arch vessel origins. Right carotid system: No evidence of dissection, stenosis (50% or greater) or occlusion. Left carotid system: No evidence of dissection, stenosis (50% or greater) or occlusion. Vertebral arteries: Left dominant. Accessory right V1. No  evidence of dissection, stenosis (50% or greater) or occlusion. Skeleton: Negative. Other neck: Negative. Upper chest: Negative. Review of the MIP images confirms the above findings CTA HEAD FINDINGS Anterior circulation: No significant stenosis, proximal occlusion, aneurysm, or vascular malformation. Posterior circulation: No significant  stenosis, proximal occlusion, aneurysm, or vascular malformation. Venous sinuses: As permitted by contrast timing, patent. Anatomic variants: Bilateral posterior communicating arteries. Fenestrated anterior communicating artery. Delayed phase: No abnormal intracranial enhancement. Review of the MIP images confirms the above findings IMPRESSION: 1. Patent carotid and vertebral arteries. No dissection, aneurysm, or hemodynamically significant stenosis utilizing NASCET criteria. 2. Patent anterior and posterior intracranial circulation. No large vessel occlusion, aneurysm, or significant stenosis. Electronically Signed   By: Mitzi Hansen M.D.   On: 10/10/2017 13:41   Dg Chest 2 View  Result Date: 10/10/2017 CLINICAL DATA:  Cough and slurred speech. EXAM: CHEST  2 VIEW COMPARISON:  None. FINDINGS: The heart size and mediastinal contours are within normal limits. Both lungs are clear. The visualized skeletal structures are unremarkable. IMPRESSION: No active cardiopulmonary disease. Electronically Signed   By: Harmon Pier M.D.   On: 10/10/2017 16:20   Ct Angio Neck W Or Wo Contrast  Result Date: 10/10/2017 CLINICAL DATA:  42 y/o F; code stroke this morning with dizziness. Right-sided headache. EXAM: CT ANGIOGRAPHY HEAD AND NECK TECHNIQUE: Multidetector CT imaging of the head and neck was performed using the standard protocol during bolus administration of intravenous contrast. Multiplanar CT image reconstructions and MIPs were obtained to evaluate the vascular anatomy. Carotid stenosis measurements (when applicable) are obtained utilizing NASCET criteria, using the distal internal carotid diameter as the denominator. CONTRAST:  50mL ISOVUE-370 IOPAMIDOL (ISOVUE-370) INJECTION 76% COMPARISON:  10/10/2017 CT head FINDINGS: CTA NECK FINDINGS Aortic arch: Standard branching. Imaged portion shows no evidence of aneurysm or dissection. No significant stenosis of the major arch vessel origins. Right carotid  system: No evidence of dissection, stenosis (50% or greater) or occlusion. Left carotid system: No evidence of dissection, stenosis (50% or greater) or occlusion. Vertebral arteries: Left dominant. Accessory right V1. No evidence of dissection, stenosis (50% or greater) or occlusion. Skeleton: Negative. Other neck: Negative. Upper chest: Negative. Review of the MIP images confirms the above findings CTA HEAD FINDINGS Anterior circulation: No significant stenosis, proximal occlusion, aneurysm, or vascular malformation. Posterior circulation: No significant stenosis, proximal occlusion, aneurysm, or vascular malformation. Venous sinuses: As permitted by contrast timing, patent. Anatomic variants: Bilateral posterior communicating arteries. Fenestrated anterior communicating artery. Delayed phase: No abnormal intracranial enhancement. Review of the MIP images confirms the above findings IMPRESSION: 1. Patent carotid and vertebral arteries. No dissection, aneurysm, or hemodynamically significant stenosis utilizing NASCET criteria. 2. Patent anterior and posterior intracranial circulation. No large vessel occlusion, aneurysm, or significant stenosis. Electronically Signed   By: Mitzi Hansen M.D.   On: 10/10/2017 13:41   Mr Brain Wo Contrast  Result Date: 10/10/2017 CLINICAL DATA:  Dizziness.  History of lupus.  History of prior CVA. EXAM: MRI HEAD WITHOUT CONTRAST TECHNIQUE: Multiplanar, multiecho pulse sequences of the brain and surrounding structures were obtained without intravenous contrast. COMPARISON:  CT head and CTA head neck earlier today are normal. FINDINGS: Brain: No acute infarction, hemorrhage, hydrocephalus, extra-axial collection or mass lesion. Normal cerebral volume. No white matter disease of significance. No prior large vessel infarct is identified. Vascular: Normal flow voids. Skull and upper cervical spine: Normal marrow signal. Sinuses/Orbits: Mild sinus disease.  Negative orbits.  Other: Slight layering fluid in the RIGHT mastoid air cells.  IMPRESSION: Negative exam. No evidence of acute cerebral ischemia or other focal intracranial abnormality. Electronically Signed   By: Elsie Stain M.D.   On: 10/10/2017 17:21   Mr Brain W Contrast  Result Date: 10/10/2017 CLINICAL DATA:  Dizziness today, blurry vision and speech difficulties. RIGHT facial droop. History of lupus and stroke. EXAM: MRI HEAD WITH CONTRAST MRV HEAD WITHOUT CONTRAST TECHNIQUE: Multiplanar, multiecho pulse sequences of the brain and surrounding structures were obtained with intravenous contrast. Angiographic images of the intracranial venous structures were obtained using MRV technique without intravenous contrast. COMPARISON:  MRI of the head October 10, 2017 at 1705 hours CONTRAST:  20mL MULTIHANCE GADOBENATE DIMEGLUMINE 529 MG/ML IV SOLN FINDINGS: MR HEAD FINDINGS No abnormal parenchymal or extra-axial enhancement. MRV HEAD FINDINGS Normal flow related enhancement within the superior sagittal sinus, torcula of the Herophili, bilateral transverse, sigmoid sinuses and included internal jugular veins. LEFT transverse sinus is dominant. Normal flow related enhancement of the internal cerebral veins. IMPRESSION: 1. Negative postcontrast MRI head. 2. Negative noncontrast MRV head. Electronically Signed   By: Awilda Metro M.D.   On: 10/10/2017 23:39   Mr Mrv Head Wo Cm  Result Date: 10/10/2017 CLINICAL DATA:  Dizziness today, blurry vision and speech difficulties. RIGHT facial droop. History of lupus and stroke. EXAM: MRI HEAD WITH CONTRAST MRV HEAD WITHOUT CONTRAST TECHNIQUE: Multiplanar, multiecho pulse sequences of the brain and surrounding structures were obtained with intravenous contrast. Angiographic images of the intracranial venous structures were obtained using MRV technique without intravenous contrast. COMPARISON:  MRI of the head October 10, 2017 at 1705 hours CONTRAST:  20mL MULTIHANCE GADOBENATE  DIMEGLUMINE 529 MG/ML IV SOLN FINDINGS: MR HEAD FINDINGS No abnormal parenchymal or extra-axial enhancement. MRV HEAD FINDINGS Normal flow related enhancement within the superior sagittal sinus, torcula of the Herophili, bilateral transverse, sigmoid sinuses and included internal jugular veins. LEFT transverse sinus is dominant. Normal flow related enhancement of the internal cerebral veins. IMPRESSION: 1. Negative postcontrast MRI head. 2. Negative noncontrast MRV head. Electronically Signed   By: Awilda Metro M.D.   On: 10/10/2017 23:39   Ct Head Code Stroke Wo Contrast  Result Date: 10/10/2017 CLINICAL DATA:  Code stroke. Acute onset of dizziness. Slurred speech. EXAM: CT HEAD WITHOUT CONTRAST TECHNIQUE: Contiguous axial images were obtained from the base of the skull through the vertex without intravenous contrast. COMPARISON:  None. FINDINGS: Brain: No acute infarct, hemorrhage, or mass lesion is present. The ventricles are of normal size. No significant white matter disease is present. The brainstem and cerebellum are normal. Vascular: No hyperdense vessel or unexpected calcification. Skull: Calvarium is intact. No focal lytic or blastic lesions are present. Sinuses/Orbits: Mild mucosal thickening is present throughout the ethmoid air cells and bilateral maxillary sinuses. No fluid levels are present. There is some fluid in the inferior right mastoid air cells. Left mastoid air cells and bilateral middle ear cavities are clear. ASPECTS [facility] Stroke Program Early CT Score) - Ganglionic level infarction (caudate, lentiform nuclei, internal capsule, insula, M1-M3 cortex): 7/7 - Supraganglionic infarction (M4-M6 cortex): 3/3 Total score (0-10 with 10 being normal): 10/10 IMPRESSION: 1. Normal CT appearance of the brain 2. Mild sinus disease. 3. ASPECTS is 10/10 The above was relayed via text pager to Dr. Laurence Slate on 10/10/2017 at 11:34 . Electronically Signed   By: Marin Roberts M.D.   On:  10/10/2017 11:35    DISCHARGE EXAMINATION: Vitals:   10/10/17 2047 10/11/17 0146 10/11/17 0408 10/11/17 0912  BP: (!) 145/85 (!) 151/91  134/86 (!) 123/94  Pulse: 80 (!) 56 90 86  Resp: 20 18 16 18   Temp: 98.3 F (36.8 C) 98 F (36.7 C) 98.4 F (36.9 C) 98.7 F (37.1 C)  TempSrc: Oral  Oral Oral  SpO2: 100% 100% 100% 100%   General appearance: alert, cooperative, appears stated age and no distress Resp: clear to auscultation bilaterally GI: soft, non-tender; bowel sounds normal; no masses,  no organomegaly Extremities: extremities normal, atraumatic, no cyanosis or edema  DISPOSITION: Home  Discharge Instructions    Call MD for:  extreme fatigue   Complete by:  As directed    Call MD for:  persistant dizziness or light-headedness   Complete by:  As directed    Call MD for:  persistant nausea and vomiting   Complete by:  As directed    Call MD for:  temperature >100.4   Complete by:  As directed    Diet general   Complete by:  As directed    Discharge instructions   Complete by:  As directed    Please be sure to follow-up with your primary care provider in 1-2 weeks.  You will also benefit from seeing a neurologist along with rheumatologist.  Please discuss this with your PCP.  You were cared for by a hospitalist during your hospital stay. If you have any questions about your discharge medications or the care you received while you were in the hospital after you are discharged, you can call the unit and asked to speak with the hospitalist on call if the hospitalist that took care of you is not available. Once you are discharged, your primary care physician will handle any further medical issues. Please note that NO REFILLS for any discharge medications will be authorized once you are discharged, as it is imperative that you return to your primary care physician (or establish a relationship with a primary care physician if you do not have one) for your aftercare needs so that they  can reassess your need for medications and monitor your lab values. If you do not have a primary care physician, you can call [phone] for a physician referral.   Increase activity slowly   Complete by:  As directed         Allergies as of 10/11/2017      Reactions   Sulfa Antibiotics Rash      Medication List    TAKE these medications   HYDROcodone-acetaminophen 5-325 MG tablet Commonly known as:  NORCO/VICODIN Take 1 tablet by mouth every 6 (six) hours as needed for pain.   ibuprofen 200 MG tablet Commonly known as:  ADVIL,MOTRIN Take 200-400 mg by mouth every 6 (six) hours as needed (for pain or headaches).   meclizine 25 MG tablet Commonly known as:  ANTIVERT Take 1 tablet (25 mg total) by mouth 3 (three) times daily as needed for dizziness.   methocarbamol 500 MG tablet Commonly known as:  ROBAXIN Take 500-1,000 mg by mouth 4 (four) times daily as needed for muscle spasms.         TOTAL DISCHARGE TIME: 35 mins  Osvaldo Shipper  [facility] Pager [phone]  10/11/2017, 1:15 PM

## 2017-10-11 NOTE — Progress Notes (Addendum)
Reason for consult: Dizziness, blurred vision, slurred speech  Subjective: Patient no longer feels dizzy, resting calmly. Vision on right eye apparently has improved.    ROS: negative except above.   Examination  Vital signs in last 24 hours: Temp:  [98 F (36.7 C)-98.4 F (36.9 C)] 98.4 F (36.9 C) (01/29 0408) Pulse Rate:  [56-90] 90 (01/29 0408) Resp:  [16-22] 16 (01/29 0408) BP: (108-151)/(72-91) 134/86 (01/29 0408) SpO2:  [97 %-100 %] 100 % (01/29 0408)  General: Not in distress, cooperative CVS: pulse-normal rate and rhythm RS: breathing comfortably Extremities: normal   Neuro: MS: Alert, oriented, follows commands CN: pupils equal and reactive,  EOMI, VF intact in all four quadrants in both eyes, ( unable to assess yesterday). Able to finger count with R eye.   Face: initially appears she has Bells' palsy on right side of face, however when asked look upwards forehead wrinkles on both side equally. 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 There is no drooping of the angle of her mouth. Motor: 4+/5 strength in both upper extremities and symmetric ( weak on right side yesterday)  Reflexes: 2+ bilaterally over patella, biceps, plantars: flexor Coordination: normal Gait: not tested  Basic Metabolic Panel: Recent Labs  Lab 10/10/17 1118 10/10/17 1131  NA 137 139  K 4.3 4.4  CL 105 104  CO2 23  --   GLUCOSE 102* 101*  BUN 6 6  CREATININE 0.78 0.70  CALCIUM 9.3  --     CBC: Recent Labs  Lab 10/10/17 1118 10/10/17 1131  WBC 6.7  --   NEUTROABS 3.7  --   HGB 12.3 13.3  HCT 37.5 39.0  MCV 81.2  --   PLT 255  --      Coagulation Studies: Recent Labs    10/10/17 1118  LABPROT 13.5  INR 1.04    Imaging Reviewed:   Reviewed lab results from [facility] on Care everywhere  Addendum: Normal blood work, no evidence of lupus Results for orders placed or performed in visit on 06/01/16   C-reactive protein  Result Value Ref Range  C-Reactive Protein 0.1 <0.5 mg/dL  Sedimentation rate, automated  Result Value Ref Range  Erythrocyte Sedimentation Rate 15 0 - 30 mm/h  CBC with Differential  Result Value Ref Range  WBC 4.90 4.00 - 11.00 k/uL  RBC 4.25 4.20 - 5.20 M/uL  Hemoglobin 11.7 (L) 12.0 - 16.0 g/dL  Hematocrit 35.6 35.0 - 47.0 %  MCV 83.8 83.0 - 95.0 fL  MCH 27.5 (L) 28.0 - 32.0 pg  MCHC 32.9 32.0 - 36.0 g/dL  RDW 14.2 (H) 11.0 - 14.0 %  MPV 9.7 9.0 - 12.0 fL  Platelets 270 150 - 450 k/uL  Lymphocytes Percent 36.9 15.0 - 45.0 %  Lymphocytes Abs 1.81 1.00 - 5.00 k/uL  Monocytes Percent 5.3 2.0 - 12.0 %  Monos Abs 0.26 0.00 - 1.00 k/uL  Eosinophils Percent 3.3 0.0 - 6.0 %  Eosinophils Absolute 0.16 0.00 - 0.60 k/uL  Basophils Percent 0.6 0.0 - 2.0 %  Basophils Absolute 0.03 0.00 - 0.20 k/uL  Nucleated RBC Percent 0.0 %  Nucleated RBC Abs 0.00 k/uL  Neutrophils, Percent Calculated 53.9 47.0 - 82.0 %  Neutrophils, Absolute Calculated 2.64 1.80 - 8.00 k/uL  Comprehensive metabolic panel  Result Value Ref Range  Sodium 138 136 - 145 mmol/L  Potassium 3.8 3.4 - 4.8 mmol/L  Chloride 106  98 - 107 mmol/L  CO2 24 22 - 29 mmol/L  Bun 6 (L) 7 - 19 mg/dL  Creatinine 0.7 0.6 - 1.1 mg/dL  Glucose 88 74 - 99 mg/dL  Calcium 9.1 8.5 - 10.5  Total Protein 6.8 6.0 - 8.3 g/dL  Albumin 4.0 3.2 - 5.2 g/dL  Total Bilirubin 0.6 0.3 - 1.2 mg/dL  Alk Phos 95 40 - 150  Ast 15 <35 U/L  Alt 12 <55 U/L  Anion Gap 8 5 - 15 mmol/L  est GFR 109 >60 mL/Min/1.73M2  est GFR For African Americans 126 >60 mL/Min/1.73M2  Creatine Kinase (CK)  Result Value Ref Range  Ck Total 99 30 - 170 U/L  Anti Nuclear Antibody (ANA)  Result Value Ref Range  Antinuclear Antibodies (ANA) Negative Negative  Anti Smith Antigen  Result Value Ref Range  Smith (Sm) Ab <0.2 <1.0 AI  Anti SS A AB (Anti RO)  Result Value Ref Range  SSA (Ro) Ab <0.2 <1.0 AI  Anti SS B AB (Anti LA)  Result Value Ref  Range  SSB (La) Ab <0.2 <1.0 AI  Anti Ribonuclear Protein  Result Value Ref Range  RNP Ab <0.2 <1.0 AI  Anti Double Stranded (DS) DNA  Result Value Ref Range  Anti DNA (Ds) 2 <5 IU/mL  C4 Complement  Result Value Ref Range  C4 Complement 31 10 - 38 mg/dL  Urinalysis with Reflex Microscopic  Result Value Ref Range  Color Urine Yellow Yellow  Appearance Cloudy (A) Clear  Specific Gravity Urine 1.021 1.005 - 1.030  PH Urine 5.5 5.0 - 8.0  Protein Urine Negative Negative  Glucose UA Negative Negative  Ketone Negative Negative  Bilirubin Urine Negative Negative  Blood Urine Negative Negative  Nitrite Negative Negative  Urobilinogen 0.2 0.2 - 1.0 EU  Leukocytes Esterase Moderate (A) Negative  WBC Urine 10-20 (A) <3 - 5 /HPF  RBC Urine Rare <3 - 5 /HPF  Cast Urine Rare Negative /LPF  Epithelial Cells Urine Many /HPF  Bacteria Urine Negative Negative  Mucus, Urine Moderate  Urine Protein/Creatinine Ratio  Result Value Ref Range  Protein Urine 143.0 (H) 10.0 - 140.0 mg/L  Creatinine, Urine 196.8 mg/dL  Urine Total Protein/Creat Ratio 0.07 <=0.15 g/g  Vitamin D 25 hydroxy  Result Value Ref Range  25 Hydroxy D2 <5 ng/mL  25 Hydroxy D3 22 ng/mL  Total Vit D 25 Hydroxy 22 (L) 25 - 80 ng/mL  TSH Reflexive  Result Value Ref Range  TSH 1.60 0.45 - 4.50 mIU/L  Free T4 0.7 - 1.5 ng/dL  Phospholipid IgG Screen  Result Value Ref Range  Anti-Cardiolipin IgG <1.6 <20.0 GPL-U/MI  Beta-2 Glycoprotein 1 Igg <1.4 <20.0 U/mL  Phospholipid IgM Screen  Result Value Ref Range  Anti-Cardiolipin IgM 1.2 <20.0 MPL-U/ML  Beta-2 Glycoprotein 1 Igm 1.4 <20.0 U/mL  Lupus Anticoagulant Panel  Result Value Ref Range  Dilute Russell Viper Venom (dRVVT) Screen 32.2 22.4 - 82.5 sec  Silica Clotting Time (SCT) Screen 41.7 31.6 - 54.4 sec  Hepatitis Profile B,C  Result Value Ref Range  Hepatitis B Surface Antigen Screen Non-Reactive Non-Reactive  Hepatitis B Surface Antibody 0.3 mIU/mL  Hepatitis B  Core Antibody, Total Non-Reactive Non-Reactive  Hepatitis C Antibody Non-Reactive Non-Reactive  Miscellaneous Sendout Lab Test (Non-Genetic)  Result Value Ref Range  Results Non Genetic (Jmisc2) SEE COMMENTS  Protime  Result Value Ref Range  Protime 11.5 10.2 - 13.0 sec  Protime INR 1.0 0.9 - 1.2  PTT  Result  Value Ref Range  PTT 29.4 27.8 - 37.6 sec  Lupus Anticoagulant Interpretation  Result Value Ref Range  Lupus Anticoagulant Interpretation No evidence of lupus anticoagulant.       ASSESSMENT AND PLAN   42 y.o. 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   MRI brain negative for acute stroke with no evidence of moderate to large old stroke or demyelinating lesions. There are some nonspecific T2 white matter hyperintensities. There are several inconsistencies  and multiple neurological symptoms seem to be more functional and  difficult to localize to  cranial nerve abnormalities ( please refer to exam).  In prior notes ( on care everywhere) she had left side weakness, yesterday had right side weakness and today appears slightly weaker in both upper extremities. MRI brain with contrast and MR venogram also normal.   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 She was diagnosed at age 58 and rediagnosed at 89 after stroke during pregnancy. She apparently had positive ANA and had hair loss, nasal and scalp sores which led to the diagnosis.  She was started  on plaquenil by her prior rheumatologist.  However on reviewing notes from [facility] appointment on 06/01/2016 blood work for lupus done came back negative and as well as negative lupus anticoagulant panel ( anticardiolipin, phospholipid and b2 glycoprotein). She also states she was diagnosed Tourette's at [facility] for tremors, but later thought to be  due to medication side effect. .   Impression  Possible somatoform disorder/migraine Vertigo    Recommendations No further neurological workup needed as inpatient- MRI Aaron Edelman w/wo contrast negative and MRV negative. CTA Head and neck also negative for dissection, aneursym. Ruled out stroke/CVT/tumors/encahncing lesions. If her symptoms persist, an LP can be performed as outpatient.  Follow up with neurology and rheumatology as outpatient  Follow up with Ophthalmology for R eye blurring vision.      Karena Addison Divina Neale [facility] Pager Number [phone] For questions after 7pm please refer to AMION to reach the Neurologist on call

## 2017-10-11 NOTE — Progress Notes (Signed)
Pt off unit to MRI. Unable to complete q2 vital signs at this time.

## 2017-10-12 LAB — URINE CULTURE

## 2018-08-09 IMAGING — MR MR HEAD W/O CM
9 of 10 series · 37 of 48 positions shown · non-contrast
Comparison: CT head and CTA head neck earlier today are normal.

CLINICAL DATA: Dizziness.  History of lupus.  History of prior CVA.

EXAM:
MRI HEAD WITHOUT CONTRAST
TECHNIQUE: Multiplanar, multiecho pulse sequences of the brain and surrounding
structures were obtained without intravenous contrast.

[Series 3: DWI · axial · 3.0mm · 0.94mm/px · z∈[-62,+63]mm · 8 of 89 slices shown (1 of 2)]
[im 1/89]
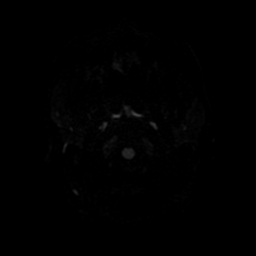
[im 10/89]
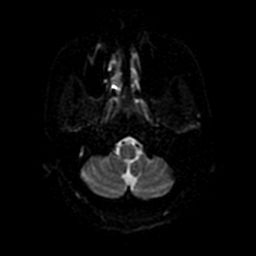
[im 30/89]
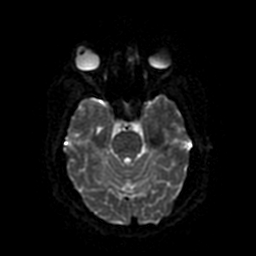
[im 40/89]
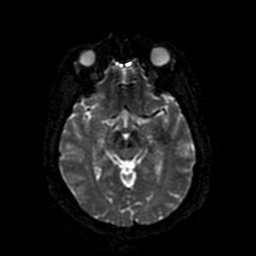
[im 49/89]
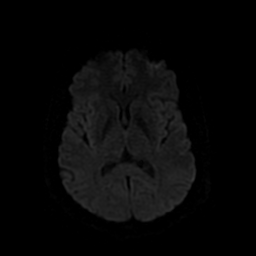
[im 59/89]
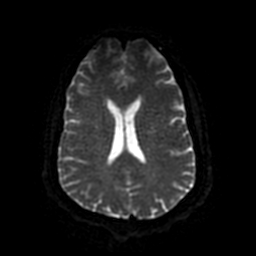
[im 79/89]
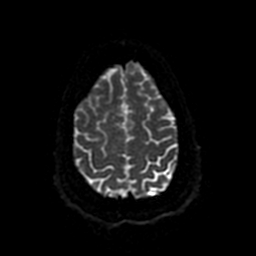
[im 89/89]
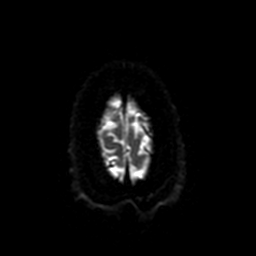

[Series 4: FLAIR · axial · 3.0mm · 0.94mm/px · z∈[-62,+63]mm · 2 of 23 slices shown (1 of 2)]
[im 1/23]
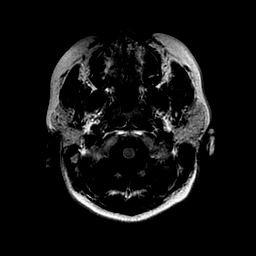
[im 23/23]
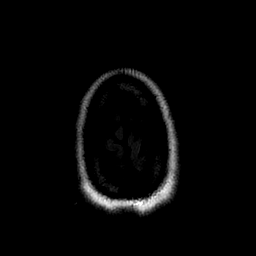

[Series 5: (person_name) · axial · 3.0mm · 0.47mm/px · z∈[-64,+16]mm · 5 of 92 slices shown]
[im 1/92]
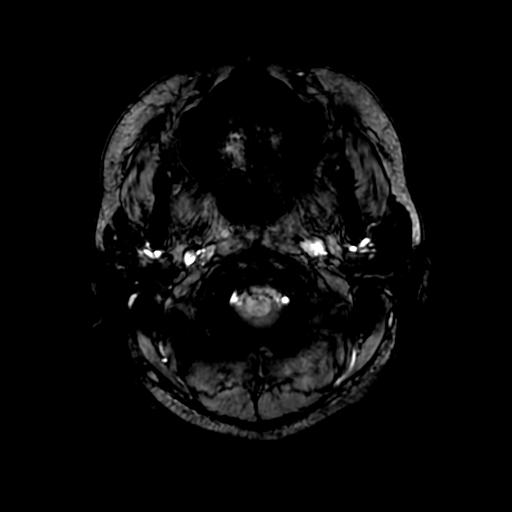
[im 12/92]
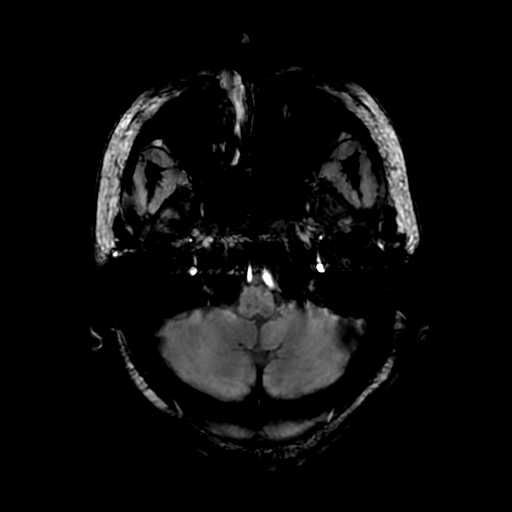
[im 23/92]
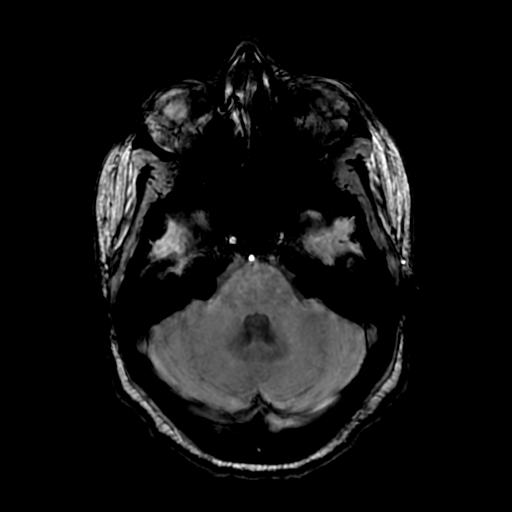
[im 35/92]
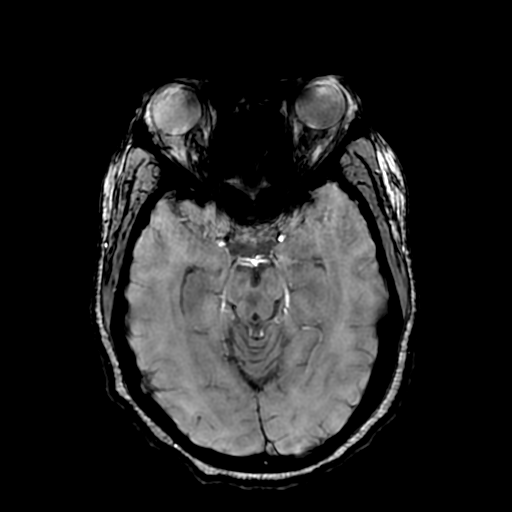
[im 57/92]
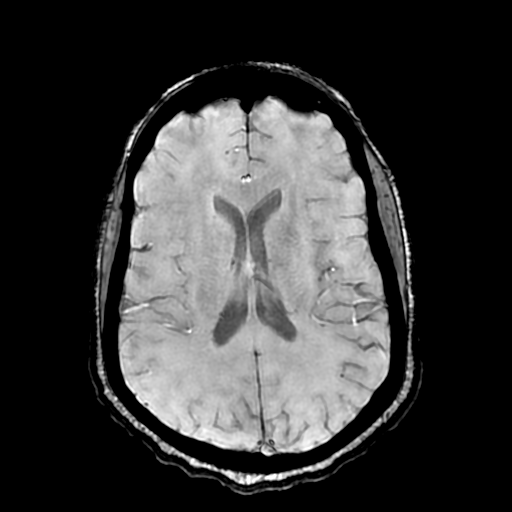

[Series 6: T2 · axial · 5.0mm · 0.47mm/px · z∈[-62,+63]mm · 2 of 23 slices shown (1 of 2)]
[im 1/23]
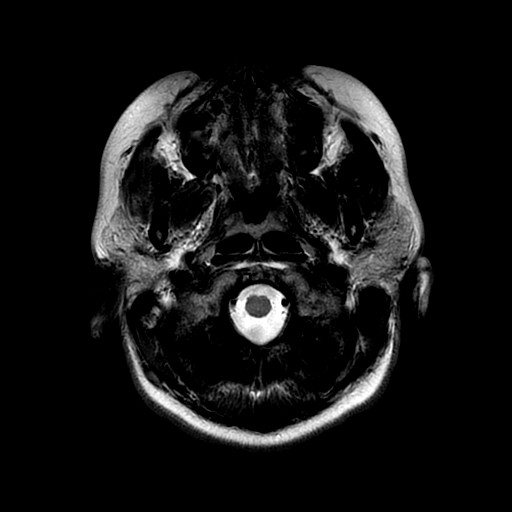
[im 23/23]
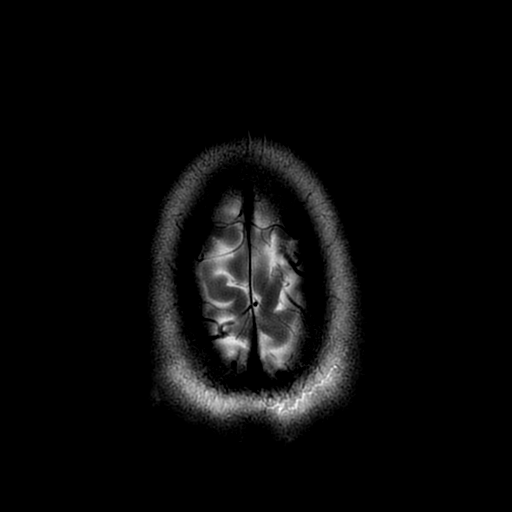

[Series 7: DWI · coronal · 4.0mm · 0.94mm/px · 7 of 70 slices shown (2 of 2)]
[im 1/70]
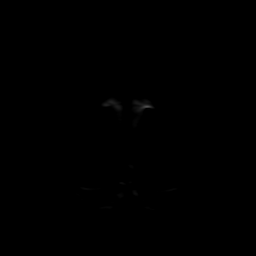
[im 12/70]
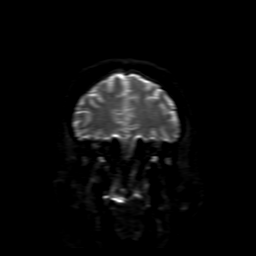
[im 24/70]
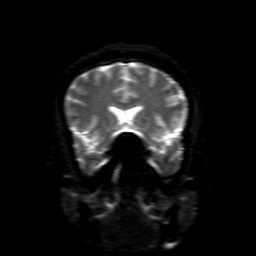
[im 35/70]
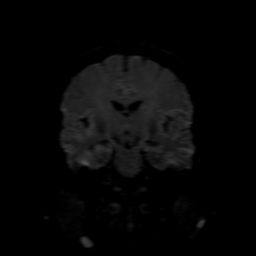
[im 47/70]
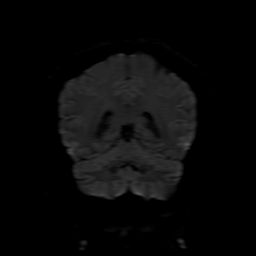
[im 58/70]
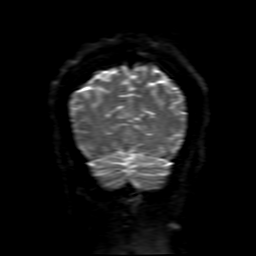
[im 70/70]
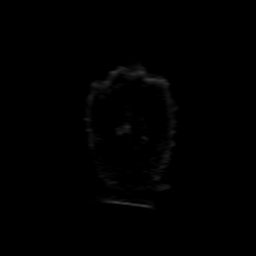

[Series 8: FLAIR · sagittal · 5.0mm · 0.47mm/px · 2 of 23 slices shown (2 of 2)]
[im 1/23]
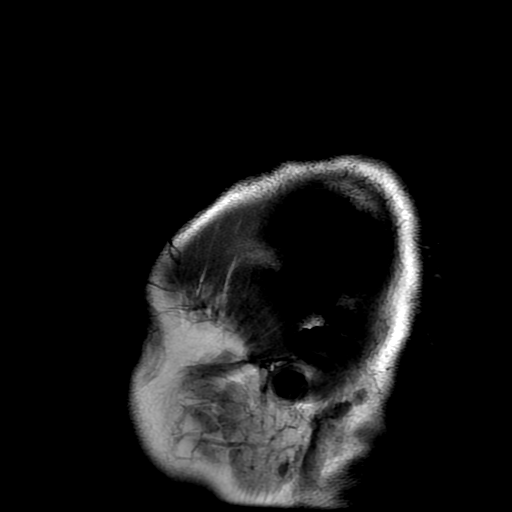
[im 23/23]
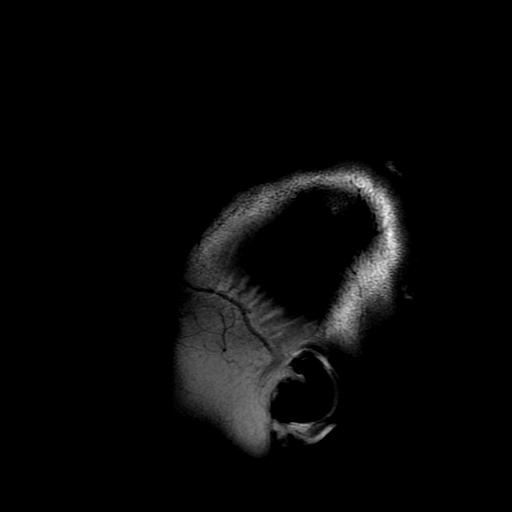

[Series 10: T2 · coronal · 5.0mm · 0.43mm/px · 3 of 29 slices shown (2 of 2)]
[im 1/29]
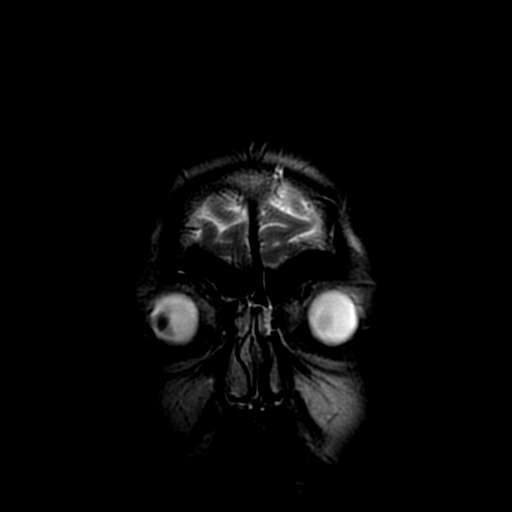
[im 15/29]
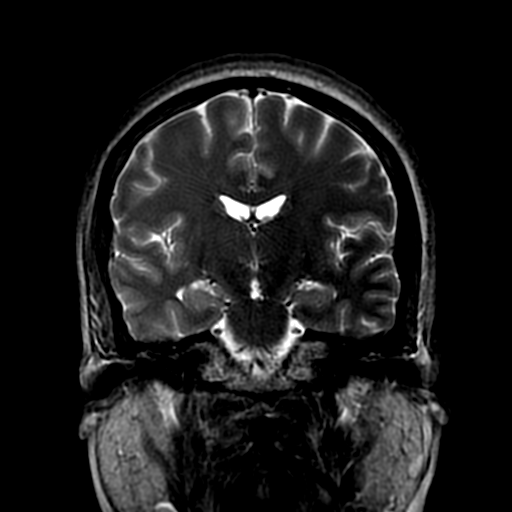
[im 29/29]
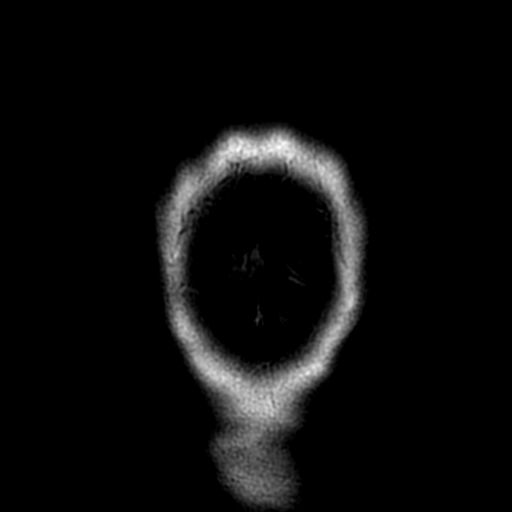

[Series 350: ADC · axial · 3.0mm · 0.94mm/px · z∈[-62,+63]mm · 4 of 44 slices shown (1 of 2)]
[im 1/44]
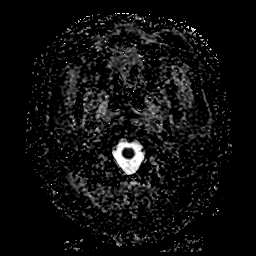
[im 15/44]
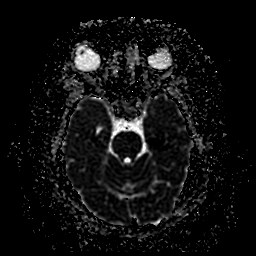
[im 29/44]
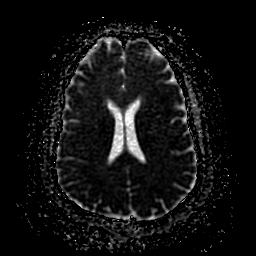
[im 44/44]
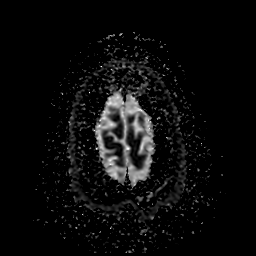

[Series 750: ADC · coronal · 4.0mm · 0.94mm/px · 4 of 35 slices shown (2 of 2)]
[im 1/35]
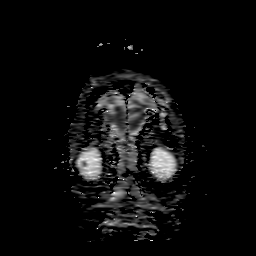
[im 12/35]
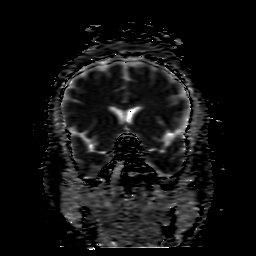
[im 23/35]
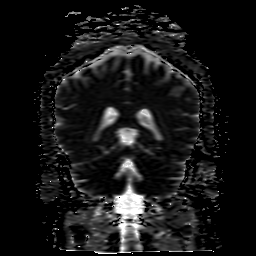
[im 35/35]
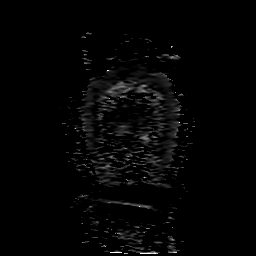

[37 of 48 positions shown; findings below may reference images not displayed]

FINDINGS: Brain: No acute infarction, hemorrhage, hydrocephalus, extra-axial
collection or mass lesion. Normal cerebral volume. No white matter
disease of significance. No prior large vessel infarct is
identified.

Vascular: Normal flow voids.

Skull and upper cervical spine: Normal marrow signal.

Sinuses/Orbits: Mild sinus disease.  Negative orbits.

Other: Slight layering fluid in the RIGHT mastoid air cells.
IMPRESSION: Negative exam. No evidence of acute cerebral ischemia or other focal
intracranial abnormality.
